# Patient Record
Sex: Male | Born: 1980 | Race: White | Hispanic: No | Marital: Married | Smoking: Current every day smoker
Health system: Southern US, Community
[De-identification: ages and names within clinical notes are randomized; demographics above are authoritative.]

## PROBLEM LIST (undated history)

## (undated) DIAGNOSIS — G35 Multiple sclerosis: Secondary | ICD-10-CM

## (undated) DIAGNOSIS — K219 Gastro-esophageal reflux disease without esophagitis: Secondary | ICD-10-CM

---

## 2015-11-23 ENCOUNTER — Ambulatory Visit
Admission: EM | Admit: 2015-11-23 | Discharge: 2015-11-23 | Disposition: A | Discharge status: Home / Self Care | Payer: BLUE CROSS/BLUE SHIELD | Attending: Family Medicine | Admitting: Family Medicine

## 2015-11-23 ENCOUNTER — Ambulatory Visit (INDEPENDENT_AMBULATORY_CARE_PROVIDER_SITE_OTHER): Payer: BLUE CROSS/BLUE SHIELD

## 2015-11-23 DIAGNOSIS — M7052 Other bursitis of knee, left knee: Secondary | ICD-10-CM | POA: Diagnosis not present

## 2015-11-23 DIAGNOSIS — B360 Pityriasis versicolor: Secondary | ICD-10-CM

## 2015-11-23 HISTORY — DX: Multiple sclerosis: G35

## 2015-11-23 MED ORDER — KETOCONAZOLE 2 % EX CREA: 1.0000 "application " | TOPICAL_CREAM | Freq: Two times a day (BID) | CUTANEOUS | Status: DC

## 2015-11-23 MED ORDER — MELOXICAM 15 MG PO TABS: 15.0000 mg | ORAL_TABLET | Freq: Every day | ORAL | Status: DC

## 2015-11-23 NOTE — Discharge Instructions (Signed)
Bursitis Bursitis is when the fluid-filled sac (bursa) that covers and protects a joint is swollen (inflamed). Bursitis is most common near joints, especially the knees, elbows, hips, and shoulders.  HOME CARE  Take medicines only as told by your doctor.  If you were prescribed an antibiotic medicine, finish it all even if you start to feel better.  Rest the affected area as told by your doctor.  Keep the area raised up.  Avoid doing things that make the pain worse.  Apply ice to the injured area:  Place ice in a plastic bag.  Place a towel between your skin and the bag.  Leave the ice on for 20 minutes, 2-3 times a day.  Use splints, braces, pads, or walking aids as told by your doctor.  Keep all follow-up visits as told by your doctor. This is important. GET HELP IF:   You have more pain with home care.  You have a fever.  You have chills.   This information is not intended to replace advice given to you by your health care provider. Make sure you discuss any questions you have with your health care provider.   Document Released: 04/05/2010 Document Revised: 11/06/2014 Document Reviewed: 01/05/2014 Elsevier Interactive Patient Education 2016 [employer] Inc[employer]  Tinea Versicolor Tinea versicolor is a skin infection that is caused by a type of yeast. It causes a rash that shows up as light or dark patches on the skin. It often occurs on the chest, back, neck, or upper arms. The condition usually does not cause other problems. In most cases, it goes away in a few weeks with treatment. The infection cannot be spread by person to another person. HOME CARE  Take medicines only as told by your doctor.  Scrub your skin every day with a dandruff shampoo as told by your doctor.  Do not scratch your skin in the rash area.  Avoid places that are hot and humid.  Do not use tanning booths.  Try to avoid sweating a lot. GET HELP IF:  Your symptoms get worse.  You have a  fever.  You have redness, swelling, or pain in the area of your rash.  You have fluid, blood, or pus coming from your rash.  Your rash comes back after treatment.   This information is not intended to replace advice given to you by your health care provider. Make sure you discuss any questions you have with your health care provider.   Document Released: 09/28/2008 Document Revised: 11/06/2014 Document Reviewed: 07/28/2014 Elsevier Interactive Patient Education [employer]

## 2015-11-23 NOTE — ED Provider Notes (Signed)
CSN: [id number]     Arrival date & time 11/23/15  1818 History   First MD Initiated Contact with Patient 11/23/15 1934    Nurses notes were reviewed. Chief Complaint  Patient presents with  . Knee Pain  . Rash   Patient was seen for left knee pain. He states that he did jump off his truck about a week ago he started noticing some pain in the left knee afterwards. Did not have that much pain jumping. States is worse when he bends his knee better when he is keeping his knee straight. States no surgery arm are problems with the knee before.  He has a rash on his back extends to his right axillary area. This rash is consistent with rash she's had before where it will spread over his back and is on. Seems like is worse with Odis Luster starts working hard and sweats a lot. He's been on medication before one time they told him that I think it may be a fungal infection. That time the creams did seem to help but he ran out of cream before he could take it long enough time seemed like to him. They moved to the area about months ago and has not established himself with a PCP.     (Consider location/radiation/quality/duration/timing/severity/associated sxs/prior Treatment) Patient is a 35 y.o. male presenting with knee pain and rash. The history is provided by the patient and the spouse. No language interpreter was used.  Knee Pain Location:  Knee Time since incident:  1 week Injury: yes   Mechanism of injury comment:  Jumping off a truck Knee location:  L knee Pain details:    Quality:  Sharp and shooting   Severity:  Moderate   Onset quality:  Unable to specify   Timing:  Sporadic   Progression:  Waxing and waning Chronicity:  New Dislocation: no   Foreign body present:  No foreign bodies Relieved by:  Immobilization and rest Worsened by:  Nothing tried Ineffective treatments:  None tried Associated symptoms: swelling   Associated symptoms: no decreased ROM, no muscle weakness, no numbness and  no stiffness   Risk factors: no concern for non-accidental trauma, no known bone disorder and no recent illness   Rash Location:  Torso and shoulder/arm Shoulder/arm rash location:  R axilla Torso rash location:  Upper back Quality: dryness, itchiness, redness and scaling   Severity:  Moderate Onset quality:  Sudden Timing:  Sporadic Progression:  Unable to specify Context comment:  Worse when he sweats Relieved by:  Nothing Ineffective treatments:  None tried Associated symptoms: no abdominal pain, no diarrhea, no induration, no joint pain, no myalgias, no sore throat, no throat swelling and no tongue swelling     Past Medical History  Diagnosis Date  . Multiple sclerosis (HCC)    History reviewed. No pertinent past surgical history. History reviewed. No pertinent family history. Social History  Substance Use Topics  . Smoking status: Current Every Day Smoker -- 1.50 packs/day  . Smokeless tobacco: None  . Alcohol Use: No    Review of Systems  HENT: Negative for sore throat.   Gastrointestinal: Negative for abdominal pain and diarrhea.  Musculoskeletal: Negative for myalgias, arthralgias and stiffness.  Skin: Positive for rash.  All other systems reviewed and are negative.   Allergies  Shellfish allergy  Home Medications   Prior to Admission medications   Medication Sig Start Date End Date Taking? Authorizing Provider  ketoconazole (NIZORAL) 2 % cream Apply 1 application  topically 2 (two) times daily. 11/23/15   Hassan Rowan, MD  meloxicam (MOBIC) 15 MG tablet Take 1 tablet (15 mg total) by mouth daily. 11/23/15   Hassan Rowan, MD   Meds Ordered and Administered this Visit  Medications - No data to display  BP 144/92 mmHg  Pulse 83  Temp(Src) 98.2 F (36.8 C) (Oral)  Resp 16  Ht  (1.651 m)  Wt 196 lb (88.905 kg)  BMI 32.62 kg/m2  SpO2 100% No data found.   Physical Exam  Constitutional: He is oriented to person, place, and time. He appears  well-developed and well-nourished.  HENT:  Head: Normocephalic and atraumatic.  Eyes: Pupils are equal, round, and reactive to light.  Musculoskeletal: Normal range of motion. He exhibits tenderness. He exhibits no edema.       Left knee: He exhibits no deformity, no laceration, no erythema and no LCL laxity. Tenderness found.       Legs: Patient has what appears be a left medial upper knee bursitis. His versus moved reproduced the pain and tenderness that he has and the bursa appears be somewhat irregular and rough during palpation.  Neurological: He is alert and oriented to person, place, and time.  Skin: Skin is warm. Rash noted. No laceration noted. Rash is maculopapular. He is not diaphoretic.     /6 seconds consistent with a fungal infection    ED Course  Procedures (including critical care time)  Labs Review Labs Reviewed - No data to display  Imaging Review Dg Knee Ap/lat W/sunrise Left  11/23/2015  CLINICAL DATA:  Chronic left knee pain which has worsened over the past 4 days. Initial encounter. EXAM: LEFT KNEE 3 VIEWS COMPARISON:  None. FINDINGS: There is no evidence of fracture, dislocation, or joint effusion. There is no evidence of arthropathy or other focal bone abnormality. Soft tissues are unremarkable. IMPRESSION: Negative exam. Electronically Signed   By: Drusilla Kanner M.D.   On: 11/23/2015 20:05     Visual Acuity Review  Right Eye Distance:   Left Eye Distance:   Bilateral Distance:    Right Eye Near:   Left Eye Near:    Bilateral Near:         MDM   1. Bursitis of left knee   2. Tinea versicolor    Patient will be sent home on Mobic 15 mg 1 tablet a day. Explained to him that he might need to have injection in the left bursa if the pain continues. Will give a work since he worked today for Wednesday and Thursday but he'll need to see a PCP or return for injection versus not better by Thursday. For the rash think this is tinea versicolor recommend  Nizoral cream. Explained to him that he is not going to get Nizoral tablets for me this will need to be followed by PCP a dermatologist and a lack make a decision when to go systemic antifungal medication. Will give Nizoral cream to use twice a day for about 2 weeks and hopefully I can control the rash.      Hassan Rowan, MD 11/23/15 2033

## 2015-11-23 NOTE — ED Notes (Signed)
Left knee pain x 1 week. 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 Pt also c/o rash under right arm "for a while". Rash is intermittent.

## 2015-12-16 ENCOUNTER — Encounter: Payer: Self-pay | Admitting: Gynecology

## 2015-12-16 ENCOUNTER — Ambulatory Visit
Admission: EM | Admit: 2015-12-16 | Discharge: 2015-12-16 | Disposition: A | Discharge status: Home / Self Care | Payer: BLUE CROSS/BLUE SHIELD | Attending: Family Medicine | Admitting: Family Medicine

## 2015-12-16 DIAGNOSIS — K047 Periapical abscess without sinus: Secondary | ICD-10-CM

## 2015-12-16 MED ORDER — KETOROLAC TROMETHAMINE 60 MG/2ML IM SOLN
60.0000 mg | Freq: Once | INTRAMUSCULAR | Status: AC
  Administered 2015-12-16: 60 mg via INTRAMUSCULAR

## 2015-12-16 MED ORDER — MELOXICAM 15 MG PO TABS: 15.0000 mg | ORAL_TABLET | Freq: Every day | ORAL | Status: DC

## 2015-12-16 MED ORDER — AMOXICILLIN-POT CLAVULANATE 875-125 MG PO TABS: 1.0000 | ORAL_TABLET | Freq: Two times a day (BID) | ORAL | Status: DC

## 2015-12-16 MED ORDER — HYDROCODONE-ACETAMINOPHEN 5-325 MG PO TABS: 1.0000 | ORAL_TABLET | Freq: Three times a day (TID) | ORAL | Status: DC | PRN

## 2015-12-16 NOTE — ED Provider Notes (Signed)
CSN: [id number]     Arrival date & time 12/16/15  1820 History   First MD Initiated Contact with Patient 12/16/15 2049    Nurses notes were reviewed. Chief Complaint  Patient presents with  . Facial Pain   Patient reports having a history of dental abscesses. He states he states she's got another one. He's been lucky because most times the resolved at home but not this time. The ports pain in the right upper gum and jaw trimmed underneath the right eye and the right upper lip. States no CC or Maurine Minister but because of cost and lack of insurance has not been. States he is aware. Most of his teeth will be extracted near future.  He still smokes. History of MS no stiff family medical history this time.   (Consider location/radiation/quality/duration/timing/severity/associated sxs/prior Treatment) Patient is a 35 y.o. male presenting with tooth pain. The history is provided by the patient and the spouse.  Dental Pain Quality:  Pressure-like and localized Severity:  Severe Duration:  3 days Timing:  Constant Progression:  Worsening Chronicity:  Recurrent Context: abscess, dental caries, dental fracture and poor dentition   Previous work-up:  Dental exam Relieved by:  Nothing Ineffective treatments:  NSAIDs Associated symptoms: congestion, facial pain, fever, gum swelling, oral bleeding and oral lesions   Risk factors: lack of dental care and periodontal disease     Past Medical History  Diagnosis Date  . Multiple sclerosis (HCC)    History reviewed. No pertinent past surgical history. No family history on file. Social History  Substance Use Topics  . Smoking status: Current Every Day Smoker -- 1.50 packs/day  . Smokeless tobacco: None  . Alcohol Use: No    Review of Systems  Constitutional: Positive for fever.  HENT: Positive for congestion and mouth sores.     Allergies  Shellfish allergy  Home Medications   Prior to Admission medications   Medication Sig Start Date End  Date Taking? Authorizing Provider  ketoconazole (NIZORAL) 2 % cream Apply 1 application topically 2 (two) times daily. 11/23/15  Yes Hassan Rowan, MD  meloxicam (MOBIC) 15 MG tablet Take 1 tablet (15 mg total) by mouth daily. 11/23/15  Yes Hassan Rowan, MD  amoxicillin-clavulanate (AUGMENTIN) 875-125 MG tablet Take 1 tablet by mouth 2 (two) times daily. 12/16/15   Hassan Rowan, MD  HYDROcodone-acetaminophen (NORCO) 5-325 MG tablet Take 1 tablet by mouth every 8 (eight) hours as needed for moderate pain. 12/16/15   Hassan Rowan, MD  meloxicam (MOBIC) 15 MG tablet Take 1 tablet (15 mg total) by mouth daily. 12/16/15   Hassan Rowan, MD   Meds Ordered and Administered this Visit   Medications  ketorolac (TORADOL) injection 60 mg (not administered)    BP 145/97 mmHg  Pulse 77  Temp(Src) 98.5 F (36.9 C) (Oral)  Resp 18  Ht  (1.651 m)  Wt 195 lb (88.451 kg)  BMI 32.45 kg/m2  SpO2 99% No data found.   Physical Exam  Constitutional: He is oriented to person, place, and time. He appears well-developed and well-nourished.  HENT:  Head: Normocephalic and atraumatic.  Right Ear: External ear normal.  Left Ear: External ear normal.  Eyes: Conjunctivae are normal. Pupils are equal, round, and reactive to light.  Neck: Neck supple. No tracheal deviation present.  Musculoskeletal: He exhibits no edema.  Neurological: He is alert and oriented to person, place, and time.  Skin: Skin is warm and dry. No erythema.  Psychiatric: He has a normal  mood and affect.  Vitals reviewed.   ED Course  Procedures (including critical care time)  Labs Review Labs Reviewed - No data to display  Imaging Review No results found.   Visual Acuity Review  Right Eye Distance:   Left Eye Distance:   Bilateral Distance:    Right Eye Near:   Left Eye Near:    Bilateral Near:         MDM   1. Dental abscess    Patient will be placed on Mobic Vicodin for pain and Augmentin infection of the abscess.  Strongly suggest that he consider going to [facility] dental clinic for dental work since he has extensive dental issues and problems. Also stressed the need to stop smoking.    Hassan Rowan, MD 12/16/15 2113

## 2015-12-16 NOTE — Discharge Instructions (Signed)
Dental Abscess A dental abscess is pus in or around a tooth. HOME CARE  Take medicines only as told by your dentist.  If you were prescribed antibiotic medicine, finish all of it even if you start to feel better.  Rinse your mouth (gargle) often with salt water.  Do not drive or use heavy machinery, like a lawn mower, while taking pain medicine.  Do not apply heat to the outside of your mouth.  Keep all follow-up visits as told by your dentist. This is important. GET HELP IF:  Your pain is worse, and medicine does not help. GET HELP RIGHT AWAY IF:  You have a fever or chills.  Your symptoms suddenly get worse.  You have a very bad headache.  You have problems breathing or swallowing.  You have trouble opening your mouth.  You have puffiness (swelling) in your neck or around your eye.   This information is not intended to replace advice given to you by your health care provider. Make sure you discuss any questions you have with your health care provider.   Document Released: 03/02/2015 Document Reviewed: 03/02/2015 Elsevier Interactive Patient Education 2016 [employer]  Dental Care and Dentist Visits Dental care supports good overall health. Regular dental visits can also help you avoid dental pain, bleeding, infection, and other more serious health problems in the future. It is important to keep the mouth healthy because diseases in the teeth, gums, and other oral tissues can spread to other areas of the body. Some problems, such as diabetes, heart disease, and pre-term labor have been associated with poor oral health.  See your dentist every 6 months. If you experience emergency problems such as a toothache or broken tooth, go to the dentist right away. If you see your dentist regularly, you may catch problems early. It is easier to be treated for problems in the early stages.  WHAT TO EXPECT AT A DENTIST VISIT  Your dentist will look for many common oral health problems  and recommend proper treatment. At your regular dental visit, you can expect:  Gentle cleaning of the teeth and gums. This includes scraping and polishing. This helps to remove the sticky substance around the teeth and gums (plaque). Plaque forms in the mouth shortly after eating. Over time, plaque hardens on the teeth as tartar. If tartar is not removed regularly, it can cause problems. Cleaning also helps remove stains.  Periodic X-rays. These pictures of the teeth and supporting bone will help your dentist assess the health of your teeth.  Periodic fluoride treatments. Fluoride is a natural mineral shown to help strengthen teeth. Fluoride treatmentinvolves applying a fluoride gel or varnish to the teeth. It is most commonly done in children.  Examination of the mouth, tongue, jaws, teeth, and gums to look for any oral health problems, such as:  Cavities (dental caries). This is decay on the tooth caused by plaque, sugar, and acid in the mouth. It is best to catch a cavity when it is small.  Inflammation of the gums caused by plaque buildup (gingivitis).  Problems with the mouth or malformed or misaligned teeth.  Oral cancer or other diseases of the soft tissues or jaws. KEEP YOUR TEETH AND GUMS HEALTHY For healthy teeth and gums, follow these general guidelines as well as your dentist's specific advice:  Have your teeth professionally cleaned at the dentist every 6 months.  Brush twice daily with a fluoride toothpaste.  Floss your teeth daily.  Ask your dentist if  you need fluoride supplements, treatments, or fluoride toothpaste.  Eat a healthy diet. Reduce foods and drinks with added sugar.  Avoid smoking. TREATMENT FOR ORAL HEALTH PROBLEMS If you have oral health problems, treatment varies depending on the conditions present in your teeth and gums.  Your caregiver will most likely recommend good oral hygiene at each visit.  For cavities, gingivitis, or other oral health  disease, your caregiver will perform a procedure to treat the problem. This is typically done at a separate appointment. Sometimes your caregiver will refer you to another dental specialist for specific tooth problems or for surgery. SEEK IMMEDIATE DENTAL CARE IF:  You have pain, bleeding, or soreness in the gum, tooth, jaw, or mouth area.  A permanent tooth becomes loose or separated from the gum socket.  You experience a blow or injury to the mouth or jaw area.   This information is not intended to replace advice given to you by your health care provider. Make sure you discuss any questions you have with your health care provider.   Document Released: 06/28/2011 Document Revised: 01/08/2012 Document Reviewed: 06/28/2011 Elsevier Interactive Patient Education 2016 [employer] Inc.  Dental Pain Dental pain may be caused by many things, including:  Tooth decay (cavities or caries). Cavities cause the nerve of your tooth to be open to air and hot or cold temperatures. This can cause pain or discomfort.  Abscess or infection. A dental abscess is an area that is full of infected pus from a bacterial infection in the inner part of the tooth (pulp). It usually happens at the end of the tooth's root.  Injury.  An unknown reason (idiopathic). Your pain may be mild or severe. It may only happen when:  You are chewing.  You are exposed to hot or cold temperature.  You are eating or drinking sugary foods or beverages, such as:  Soda.  Candy. Your pain may also be there all of the time. HOME CARE Watch your dental pain for any changes. Do these things to lessen your discomfort:  Take medicines only as told by your dentist.  If your dentist tells you to take an antibiotic medicine, finish all of it even if you start to feel better.  Keep all follow-up visits as told by your dentist. This is important.  Do not apply heat to the outside of your face.  Rinse your mouth or gargle with  salt water if told by your dentist. This helps with pain and swelling.  You can make salt water by adding  tsp of salt to 1 cup of warm water.  Apply ice to the painful area of your face:  Put ice in a plastic bag.  Place a towel between your skin and the bag.  Leave the ice on for 20 minutes, 2-3 times per day.  Avoid foods or drinks that cause you pain, such as:  Very hot or very cold foods or drinks.  Sweet or sugary foods or drinks. GET HELP IF:  Your pain is not helped with medicines.  Your symptoms are worse.  You have new symptoms. GET HELP RIGHT AWAY IF:  You cannot open your mouth.  You are having trouble breathing or swallowing.  You have a fever.  Your face, neck, or jaw is puffy (swollen).   This information is not intended to replace advice given to you by your health care provider. Make sure you discuss any questions you have with your health care provider.   Document Released:  04/03/2008 Document Revised: 03/02/2015 Document Reviewed: 10/12/2014 Elsevier Interactive Patient Education [employer]

## 2015-12-16 NOTE — ED Notes (Signed)
Patient c/o right upper jaw abscess. Patient stated painful x 3 day.

## 2016-12-21 IMAGING — CR DG KNEE AP/LAT W/ SUNRISE*L*
5 series · 5 of 5 positions shown · non-contrast
Comparison: None.

CLINICAL DATA: Chronic left knee pain which has worsened over the
past 4 days. Initial encounter.

EXAM:
LEFT KNEE 3 VIEWS

[knee ap]
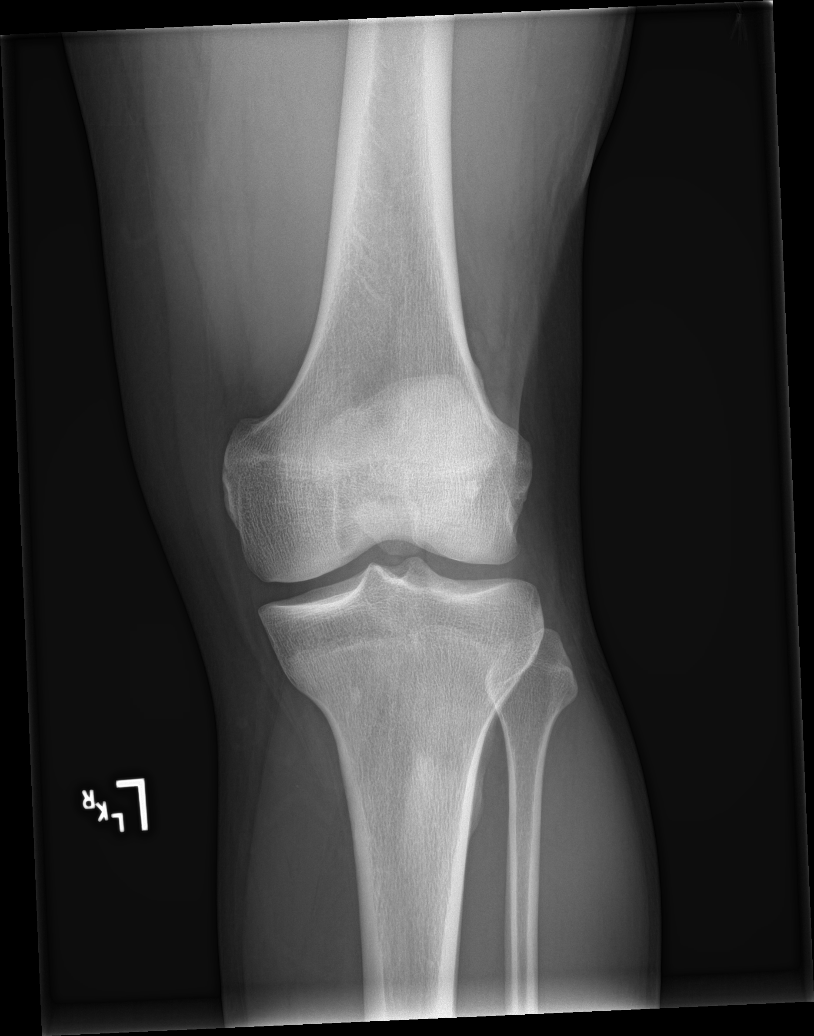

[knee lat (1 of 2)]
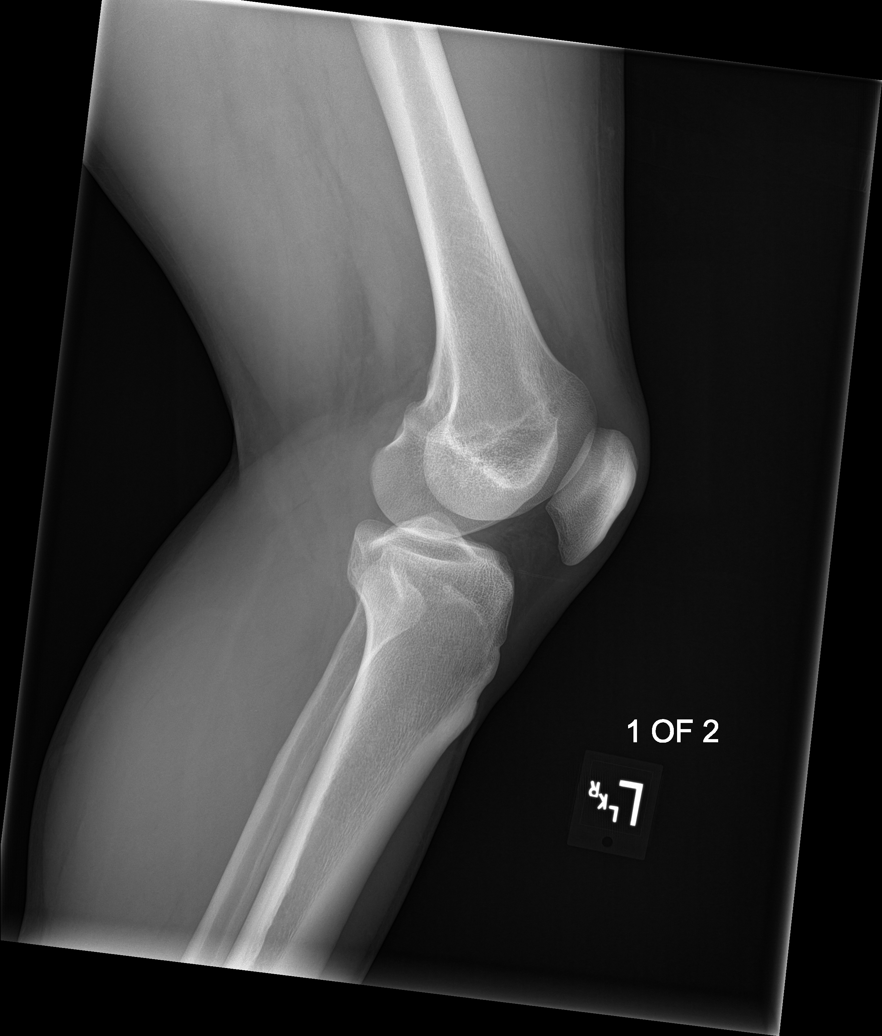

[patella skyline (1 of 2)]
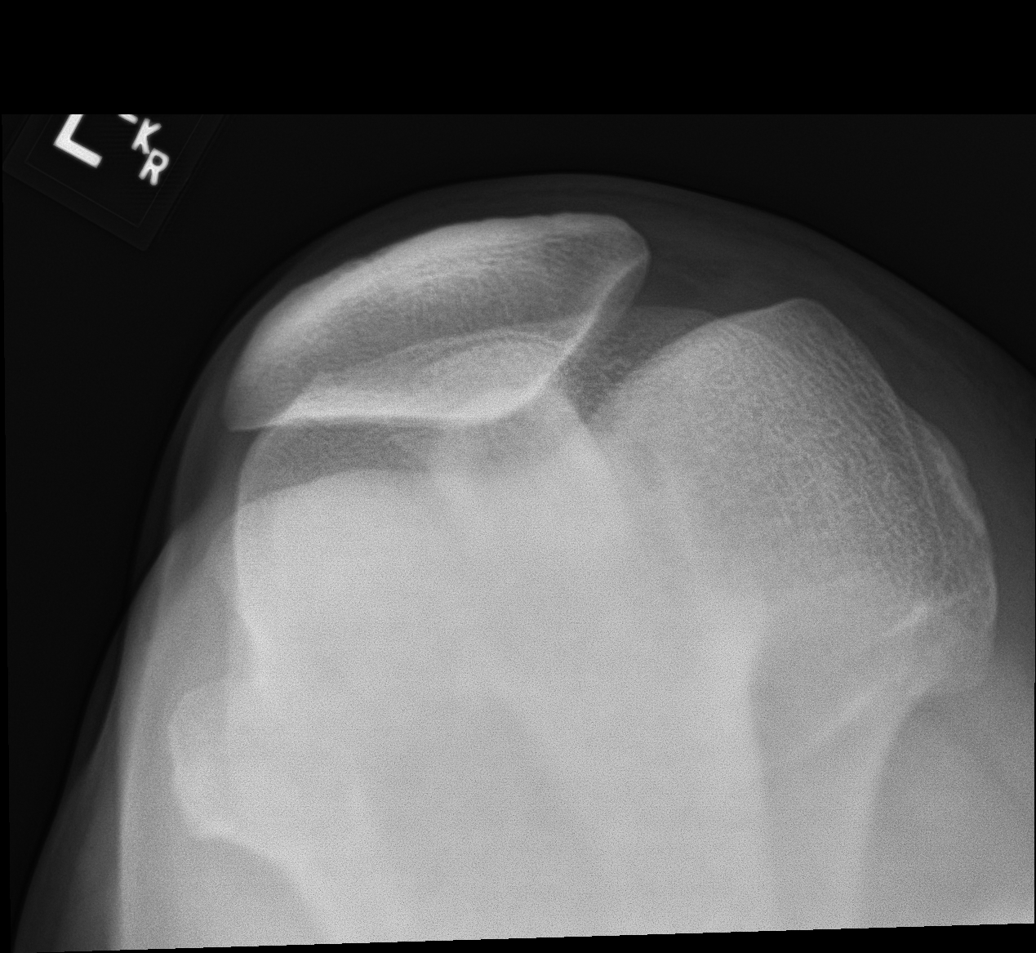

[knee lat (2 of 2)]
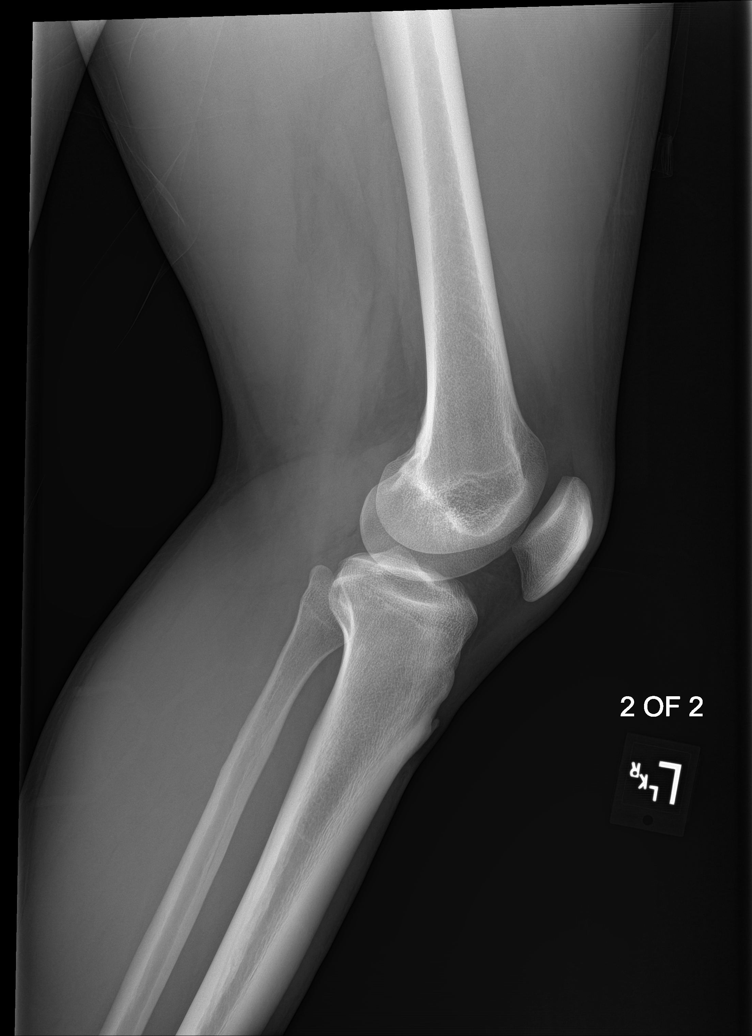

[patella skyline (2 of 2)]
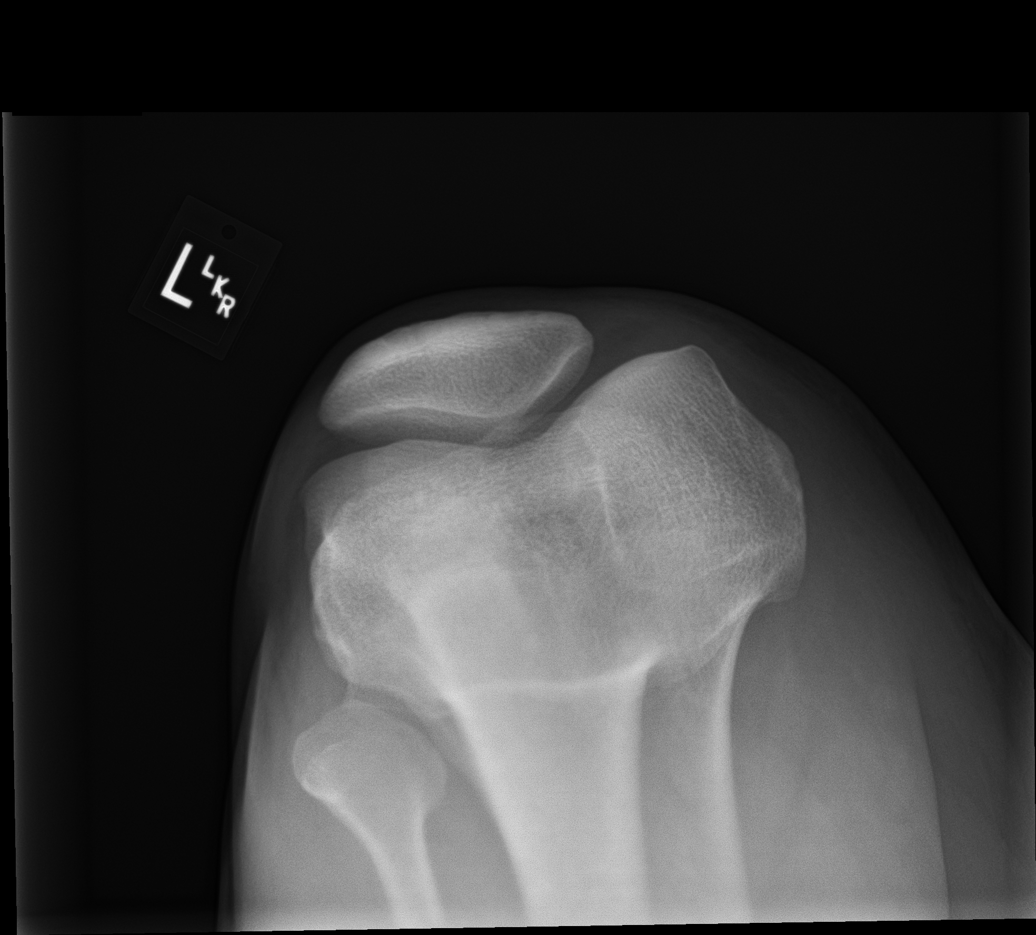

[5 of 5 positions shown; findings below may reference images not displayed]

FINDINGS: There is no evidence of fracture, dislocation, or joint effusion.
There is no evidence of arthropathy or other focal bone abnormality.
Soft tissues are unremarkable.
IMPRESSION: Negative exam.

## 2017-03-05 ENCOUNTER — Ambulatory Visit
Admission: EM | Admit: 2017-03-05 | Discharge: 2017-03-05 | Disposition: A | Discharge status: Home / Self Care | Payer: 59 | Attending: Family Medicine | Admitting: Family Medicine

## 2017-03-05 ENCOUNTER — Encounter: Payer: Self-pay | Admitting: Emergency Medicine

## 2017-03-05 DIAGNOSIS — S0501XA Injury of conjunctiva and corneal abrasion without foreign body, right eye, initial encounter: Secondary | ICD-10-CM

## 2017-03-05 DIAGNOSIS — T1591XA Foreign body on external eye, part unspecified, right eye, initial encounter: Secondary | ICD-10-CM | POA: Diagnosis not present

## 2017-03-05 DIAGNOSIS — R21 Rash and other nonspecific skin eruption: Secondary | ICD-10-CM

## 2017-03-05 MED ORDER — MOXIFLOXACIN HCL 0.5 % OP SOLN: 1.0000 [drp] | Freq: Three times a day (TID) | OPHTHALMIC | 0 refills | Status: DC

## 2017-03-05 MED ORDER — CLOTRIMAZOLE-BETAMETHASONE 1-0.05 % EX CREA: TOPICAL_CREAM | CUTANEOUS | 0 refills | Status: DC

## 2017-03-05 NOTE — ED Provider Notes (Signed)
[facility]    CSN: [id number] Arrival date & time: 03/05/17  1656     History   Chief Complaint Chief Complaint  Patient presents with  . Eye Problem  . Rash    HPI Phillip Palmer C Krotzer is a 36 y.o. male.    Eye Problem  Location:  Right eye Quality:  Tearing and foreign body sensation Severity:  Moderate Onset quality:  Sudden Duration:  3 hours Timing:  Constant Progression:  Unchanged Chronicity:  New Context: foreign body (dirt/dust)   Foreign body:  Dirt Relieved by:  Nothing Associated symptoms: redness and tearing   Associated symptoms: no blurred vision, no crusting, no decreased vision, no discharge, no double vision, no facial rash, no headaches, no inflammation, no itching, no nausea, no numbness, no photophobia, no scotomas, no swelling, no tingling, no vomiting and no weakness   Risk factors: no conjunctival hemorrhage, no exposure to pinkeye, no previous injury to eye and no recent URI   Rash  Associated symptoms: no headaches, no nausea, no periorbital edema and not vomiting     Past Medical History:  Diagnosis Date  . Multiple sclerosis (HCC)     There are no active problems to display for this patient.   History reviewed. No pertinent surgical history.     Home Medications    Prior to Admission medications   Medication Sig Start Date End Date Taking? Authorizing Provider  clotrimazole-betamethasone (LOTRISONE) cream Apply to affected area 2 times daily prn 03/05/17   Payton Mccallumonty, Taquila Leys, MD  moxifloxacin (VIGAMOX) 0.5 % ophthalmic solution Place 1 drop into the right eye 3 (three) times daily. For 7 days 03/05/17   Payton Mccallumonty, Ezell Melikian, MD    Family History History reviewed. No pertinent family history.  Social History Social History  Substance Use Topics  . Smoking status: Current Every Day Smoker    Packs/day: 1.50  . Smokeless tobacco: Not on file  . Alcohol use No     Allergies   Shellfish allergy   Review of Systems Review  of Systems  Eyes: Positive for redness. Negative for blurred vision, double vision, photophobia, discharge and itching.  Gastrointestinal: Negative for nausea and vomiting.  Skin: Positive for rash.  Neurological: Negative for tingling, weakness, numbness and headaches.     Physical Exam Triage Vital Signs ED Triage Vitals  Enc Vitals Group     BP 03/05/17 1724 (!) 153/92     Pulse Rate 03/05/17 1724 86     Resp 03/05/17 1724 16     Temp 03/05/17 1724 98 F (36.7 C)     Temp Source 03/05/17 1724 Oral     SpO2 03/05/17 1724 99 %     Weight 03/05/17 1725 198 lb (89.8 kg)     Height 03/05/17 1725 5\' 5"  (1.651 m)     Head Circumference --      Peak Flow --      Pain Score 03/05/17 1725 5     Pain Loc --      Pain Edu? --      Excl. in GC? --    No data found.   Updated Vital Signs BP (!) 153/92 (BP Location: Left Arm)   Pulse 86   Temp 98 F (36.7 C) (Oral)   Resp 16   Ht 5\' 5"  (1.651 m)   Wt 198 lb (89.8 kg)   SpO2 99%   BMI 32.95 kg/m   Visual Acuity Right Eye Distance: 20/40 uncorrected Left Eye Distance:  20/20 uncorrected Bilateral Distance:    Right Eye Near:   Left Eye Near:    Bilateral Near:     Physical Exam  Constitutional: He appears well-developed and well-nourished. No distress.  Eyes: EOM are normal. Pupils are equal, round, and reactive to light. Right eye exhibits no discharge. Foreign body present in the right eye. Left eye exhibits no discharge. Right conjunctiva is injected. Left conjunctiva is not injected. No scleral icterus.  Slit lamp exam:      The right eye shows corneal abrasion and fluorescein uptake.  Skin: He is not diaphoretic.  Nursing note and vitals reviewed.    UC Treatments / Results  Labs (all labs ordered are listed, but only abnormal results are displayed) Labs Reviewed - No data to display  EKG  EKG Interpretation None       Radiology No results found.  Procedures .Foreign Body Removal Date/Time:  03/05/2017 6:47 PM Performed by: Payton Mccallum Authorized by: Payton Mccallum  Consent: Verbal consent obtained. Risks and benefits: risks, benefits and alternatives were discussed Consent given by: patient Patient understanding: patient states understanding of the procedure being performed Patient identity confirmed: verbally with patient Body area: eye Location details: right eyelid  Anesthesia: Local Anesthetic: tetracaine drops  Sedation: Patient sedated: no Patient restrained: no Localization method: eyelid eversion Removal mechanism: moist cotton swab Eye examined with fluorescein. No fluorescein uptake. Corneal abrasion size: small Corneal abrasion location: lateral No residual rust ring present. Depth: superficial Complexity: simple 1 objects recovered. Objects recovered: pinpoint dirt speck Post-procedure assessment: foreign body removed Patient tolerance: Patient tolerated the procedure well with no immediate complications   (including critical care time)  Medications Ordered in UC Medications - No data to display   Initial Impression / Assessment and Plan / UC Course  I have reviewed the triage vital signs and the nursing notes.  Pertinent labs & imaging results that were available during my care of the patient were reviewed by me and considered in my medical decision making (see chart for details).       Final Clinical Impressions(s) / UC Diagnoses   Final diagnoses:  Abrasion of right cornea, initial encounter  Foreign body, eye, right, initial encounter  Rash    New Prescriptions Discharge Medication List as of 03/05/2017  5:55 PM    START taking these medications   Details  clotrimazole-betamethasone (LOTRISONE) cream Apply to affected area 2 times daily prn, Normal    moxifloxacin (VIGAMOX) 0.5 % ophthalmic solution Place 1 drop into the right eye 3 (three) times daily. For 7 days, Starting Mon 03/05/2017, Normal       1. diagnosis reviewed  with patient 2. rx as per orders above; reviewed possible side effects, interactions, risks and benefits  3. otc analgesics prn  4. Follow-up with ophthalmology this week if symptoms worsen or don't improve   Payton Mccallum, MD 03/05/17 [phone]

## 2017-03-05 NOTE — ED Triage Notes (Signed)
Patient c/o redness, irritation and discharge in his right eye that started about 2 hours ago.  Patient c/o itchy rash on his back off and on for a year.

## 2018-03-18 ENCOUNTER — Other Ambulatory Visit: Payer: Self-pay

## 2018-03-18 ENCOUNTER — Encounter: Payer: Self-pay | Admitting: Emergency Medicine

## 2018-03-18 ENCOUNTER — Ambulatory Visit
Admission: EM | Admit: 2018-03-18 | Discharge: 2018-03-18 | Disposition: A | Discharge status: Home / Self Care | Payer: Commercial Managed Care - PPO | Attending: Family Medicine | Admitting: Family Medicine

## 2018-03-18 DIAGNOSIS — S29012A Strain of muscle and tendon of back wall of thorax, initial encounter: Secondary | ICD-10-CM

## 2018-03-18 DIAGNOSIS — M25511 Pain in right shoulder: Secondary | ICD-10-CM

## 2018-03-18 MED ORDER — KETOROLAC TROMETHAMINE 10 MG PO TABS: 10.0000 mg | ORAL_TABLET | Freq: Three times a day (TID) | ORAL | 0 refills | Status: DC | PRN

## 2018-03-18 MED ORDER — CYCLOBENZAPRINE HCL 10 MG PO TABS: 10.0000 mg | ORAL_TABLET | Freq: Every day | ORAL | 0 refills | Status: DC

## 2018-03-18 NOTE — Discharge Instructions (Addendum)
Stretch, heat/ice

## 2018-03-18 NOTE — ED Triage Notes (Signed)
Patient in today c/o right shoulder pain x 5 days. No injury noted.

## 2018-03-18 NOTE — ED Provider Notes (Signed)
[facility]    CSN: [id number] Arrival date & time: 03/18/18  1815     History   Chief Complaint Chief Complaint  Patient presents with  . Shoulder Pain    right    HPI Phillip Palmer is a 37 y.o. male.   37 yo male with a c/o right shoulder pain and right upper back pain.  Denies any trauma, injuries, fevers, chills, numbness/tingling.    The history is provided by the patient.  Shoulder Pain    History reviewed. No pertinent past medical history.  There are no active problems to display for this patient.   History reviewed. No pertinent surgical history.     Home Medications    Prior to Admission medications   Medication Sig Start Date End Date Taking? Authorizing Provider  clotrimazole-betamethasone (LOTRISONE) cream Apply to affected area 2 times daily prn 03/05/17   Payton Mccallum, MD  cyclobenzaprine (FLEXERIL) 10 MG tablet Take 1 tablet (10 mg total) by mouth at bedtime. 03/18/18   Payton Mccallum, MD  ketorolac (TORADOL) 10 MG tablet Take 1 tablet (10 mg total) by mouth every 8 (eight) hours as needed. 03/18/18   Payton Mccallum, MD  moxifloxacin (VIGAMOX) 0.5 % ophthalmic solution Place 1 drop into the right eye 3 (three) times daily. For 7 days 03/05/17   Payton Mccallum, MD    Family History Family History  Problem Relation Age of Onset  . Multiple sclerosis Mother   . Alcohol abuse Father     Social History Social History   Tobacco Use  . Smoking status: Current Every Day Smoker    Packs/day: 1.00    Years: 21.00    Pack years: 21.00  . Smokeless tobacco: Never Used  Substance Use Topics  . Alcohol use: No  . Drug use: Never     Allergies   Shellfish allergy   Review of Systems Review of Systems   Physical Exam Triage Vital Signs ED Triage Vitals  Enc Vitals Group     BP 03/18/18 1834 (!) 140/91     Pulse Rate 03/18/18 1834 91     Resp 03/18/18 1834 16     Temp 03/18/18 1834 98.4 F (36.9 C)     Temp Source 03/18/18  1834 Oral     SpO2 03/18/18 1834 98 %     Weight 03/18/18 1833 208 lb (94.3 kg)     Height 03/18/18 1833  (1.651 m)     Head Circumference --      Peak Flow --      Pain Score 03/18/18 1833 7     Pain Loc --      Pain Edu? --      Excl. in GC? --    No data found.  Updated Vital Signs BP (!) 140/91 (BP Location: Left Arm)   Pulse 91   Temp 98.4 F (36.9 C) (Oral)   Resp 16   Ht  (1.651 m)   Wt 208 lb (94.3 kg)   SpO2 98%   BMI 34.61 kg/m   Visual Acuity Right Eye Distance:   Left Eye Distance:   Bilateral Distance:    Right Eye Near:   Left Eye Near:    Bilateral Near:     Physical Exam  Constitutional: He appears well-developed and well-nourished. No distress.  Musculoskeletal:       Right shoulder: Normal.       Cervical back: He exhibits tenderness (over the right trapezius  muscle) and spasm. He exhibits normal range of motion, no bony tenderness, no swelling, no edema, no deformity, no laceration, no pain and normal pulse.  Skin: He is not diaphoretic.  Nursing note and vitals reviewed.    UC Treatments / Results  Labs (all labs ordered are listed, but only abnormal results are displayed) Labs Reviewed - No data to display  EKG None  Radiology No results found.  Procedures Procedures (including critical care time)  Medications Ordered in UC Medications - No data to display  Initial Impression / Assessment and Plan / UC Course  I have reviewed the triage vital signs and the nursing notes.  Pertinent labs & imaging results that were available during my care of the patient were reviewed by me and considered in my medical decision making (see chart for details).     Final Clinical Impressions(s) / UC Diagnoses   Final diagnoses:  Acute pain of right shoulder  Upper back strain, initial encounter     Discharge Instructions     Stretch, heat/ice   ED Prescriptions    Medication Sig Dispense Auth. Provider   cyclobenzaprine  (FLEXERIL) 10 MG tablet Take 1 tablet (10 mg total) by mouth at bedtime. 30 tablet Payton Mccallum, MD   ketorolac (TORADOL) 10 MG tablet Take 1 tablet (10 mg total) by mouth every 8 (eight) hours as needed. 15 tablet Payton Mccallum, MD      1. diagnosis reviewed with patient 2. rx as per orders above; reviewed possible side effects, interactions, risks and benefits  3. Recommend supportive treatment with stretch, heat/ice 4. Follow-up prn if symptoms worsen or don't improveControlled Substance Prescriptions [place] Controlled Substance Registry consulted? Not Applicable   Payton Mccallum, MD 03/18/18 [phone]

## 2020-03-10 ENCOUNTER — Other Ambulatory Visit: Payer: Self-pay

## 2020-03-10 ENCOUNTER — Ambulatory Visit
Admission: EM | Admit: 2020-03-10 | Discharge: 2020-03-10 | Disposition: A | Discharge status: Home / Self Care | Payer: Commercial Managed Care - PPO | Attending: Family Medicine | Admitting: Family Medicine

## 2020-03-10 DIAGNOSIS — K047 Periapical abscess without sinus: Secondary | ICD-10-CM | POA: Diagnosis not present

## 2020-03-10 MED ORDER — CEFTRIAXONE SODIUM 1 G IJ SOLR
1.0000 g | Freq: Once | INTRAMUSCULAR | Status: AC
  Administered 2020-03-10: 1 g via INTRAMUSCULAR

## 2020-03-10 MED ORDER — AMOXICILLIN 875 MG PO TABS: 875.0000 mg | ORAL_TABLET | Freq: Two times a day (BID) | ORAL | 0 refills | Status: DC

## 2020-03-10 NOTE — ED Triage Notes (Signed)
Left sided facial swelling and dental pain x 2 days. Tylenol and Advil helping with pain.

## 2020-03-10 NOTE — Discharge Instructions (Addendum)
Take medication as prescribed. Rest. Drink plenty of fluids. Monitor. Ibuprofen and tylenol as needed. Rinse mouth frequently with salt water.   Follow up with dentist as soon as possible.   Follow up with your primary care physician this week as needed. Return to Urgent care for new or worsening concerns.    OPTIONS FOR DENTAL FOLLOW UP CARE  [place] Department of Health and Human Services - Local Safety Net Dental Clinics [email or web address]   [facility] [phone])  [place]arl Ma [phone])  [place] [phone] ext [phone])  [facility] [phone])  [facility] [phone]) This clinic caters to the indigent population and is on a lottery system. Location: [employer] [facility], [employer], [address], [place] Clinic Hours: Wednesdays from 6pm - 9pm, patients seen by a lottery system. For dates, call or go to [email or web address] Services: Cleanings, fillings and simple extractions. Payment Options: DENTAL WORK IS FREE OF CHARGE. Bring proof of income or support. Best way to get seen: Arrive at 5:15 pm - this is a lottery, NOT first come/first serve, so arriving earlier will not increase your chances of being seen.     [facility] [phone] Select option 1 for emergencies   Location: [facility], [place], [address], [place] Clinic Hours: No walk-ins accepted - call the day before to schedule an appointment. Check in times are 9:30 am and 1:30 pm. Services: Simple extractions, temporary fillings, pulpectomy/pulp debridement, uncomplicated abscess drainage. Payment Options: PAYMENT IS DUE AT THE TIME OF SERVICE.  Fee is usually $100-200, additional surgical procedures (e.g. abscess drainage) may be extra. Cash, checks, Visa/MasterCard accepted.  Can file Medicaid  if patient is covered for dental - patient should call case worker to check. No discount for [facility] patients. Best way to get seen: MUST call the day before and get onto the schedule. Can usually be seen the next 1-2 days. No walk-ins accepted.     [facility] [phone]   Location: [facility], [address], [place] Clinic Hours: M, W, Th, F 8am or 1:30pm, Tues 9a or 1:30 - first come/first served. Services: Simple extractions, temporary fillings, uncomplicated abscess drainage.  You do not need to be an [facility] resident. Payment Options: PAYMENT IS DUE AT THE TIME OF SERVICE. Dental insurance, otherwise sliding scale - bring proof of income or support. Depending on income and treatment needed, cost is usually $50-200. Best way to get seen: Arrive early as it is first come/first served.     [facility] [phone]   Location: [address] Clinic Hours: Mon-Thu 8a-5p Services: Most basic dental services including extractions and fillings. Payment Options: PAYMENT IS DUE AT THE TIME OF SERVICE. Sliding scale, up to 50% off - bring proof if income or support. Medicaid with dental option accepted. Best way to get seen: Call to schedule an appointment, can usually be seen within 2 weeks OR they will try to see walk-ins - show up at 8a or 2p (you may have to wait).     [facility] [phone] [place] RESIDENTS ONLY   Location: [facility], [address], [place], [place] [address] Clinic Hours: By appointment only. Monday - Thursday 8am-5pm, Friday 8am-12pm Services: Cleanings, fillings, extractions. Payment Options: PAYMENT IS DUE AT THE TIME OF SERVICE. Cash, Visa or MasterCard. Sliding scale - $30 minimum per service. Best  way to get seen: Come in to office, complete packet and make an appointment - need proof of income or  support monies for each household member and proof of [facility] residence. Usually takes about a month to get in.     [facility] [phone]   Location: [address] [place] Clinic Hours: Walk-in Urgent Care Dental Services are offered Monday-Friday mornings only. The numbers of emergencies accepted daily is limited to the number of providers available. Maximum 15 - Mondays, Wednesdays & Thursdays Maximum 10 - Tuesdays & Fridays Services: You do not need to be a [facility] resident to be seen for a dental emergency. Emergencies are defined as pain, swelling, abnormal bleeding, or dental trauma. Walkins will receive x-rays if needed. NOTE: Dental cleaning is not an emergency. Payment Options: PAYMENT IS DUE AT THE TIME OF SERVICE. Minimum co-pay is $40.00 for uninsured patients. Minimum co-pay is $3.00 for Medicaid with dental coverage. Dental Insurance is accepted and must be presented at time of visit. Medicare does not cover dental. Forms of payment: Cash, credit card, checks. Best way to get seen: If not previously registered with the clinic, walk-in dental registration begins at 7:15 am and is on a first come/first serve basis. If previously registered with the clinic, call to make an appointment.     [facility] [place] ONLY   Location: [address] [place], [place] Clinic Hours: Mon-Thu 10a-2p Services: Extractions only! Payment Options: FREE (donations accepted) - bring proof of income or support Best way to get seen: Call and schedule an appointment OR come at 8am on the 1st Monday of every month (except for holidays) when it is first come/first served.     [facility] [phone]   Location: [place], [place] Clinic Hours: Friday mornings Services, Payment Options, Best way to get seen: Call for info

## 2020-03-10 NOTE — ED Provider Notes (Signed)
[facility] ____________________________________________  Time seen: Approximately 10:08 AM  I have reviewed the triage vital signs and the nursing notes.   HISTORY  Chief Complaint Facial Swelling and Dental Pain   HPI Phillip Palmer is a 39 y.o. male presenting for evaluation of left facial swelling and pain.  Patient reports for the last 2 days he has had a left upper tooth ache, in which she has taken ibuprofen for that helped.  States when he woke up this morning he had left facial swelling and the pain had improved.  States he has had dental abscess before with similar presentation.  States otherwise doing well.  Denies fevers, difficulty eating, drainage, chest pain or shortness of breath, recent sickness.  Denies recent antibiotic use.  Denies other aggravating alleviating factors.  States that he has a lot of dental work that is needed.    History reviewed. No pertinent past medical history.  There are no problems to display for this patient.   History reviewed. No pertinent surgical history.   No current facility-administered medications for this encounter.  Current Outpatient Medications:  .  acetaminophen (TYLENOL) 325 MG tablet, Take 650 mg by mouth every 6 (six) hours as needed., Disp: , Rfl:  .  ibuprofen (ADVIL) 200 MG tablet, Take 200 mg by mouth every 6 (six) hours as needed., Disp: , Rfl:  .  amoxicillin (AMOXIL) 875 MG tablet, Take 1 tablet (875 mg total) by mouth 2 (two) times daily., Disp: 20 tablet, Rfl: 0  Allergies Shellfish allergy  Family History  Problem Relation Age of Onset  . Multiple sclerosis Mother   . Alcohol abuse Father     Social History Social History   Tobacco Use  . Smoking status: Current Every Day Smoker    Packs/day: 1.00    Years: 21.00    Pack years: 21.00  . Smokeless tobacco: Never Used  Substance Use Topics  . Alcohol use: No  . Drug use: Never    Review of Systems Constitutional: No fever ENT:  No sore throat.  Positive dental pain and facial swelling. Cardiovascular: Denies chest pain. Respiratory: Denies shortness of breath. Gastrointestinal: No abdominal pain.  No nausea, no vomiting.  Musculoskeletal: Negative for back pain. Skin: Negative for rash.   ____________________________________________   PHYSICAL EXAM:  VITAL SIGNS: ED Triage Vitals  Enc Vitals Group     BP 03/10/20 0853 (!) 153/103     Pulse Rate 03/10/20 0853 81     Resp 03/10/20 0853 16     Temp 03/10/20 0853 97.7 F (36.5 C)     Temp src --      SpO2 03/10/20 0853 100 %     Weight 03/10/20 0854 191 lb (86.6 kg)     Height 03/10/20 0854 5\' 5"  (1.651 m)     Head Circumference --      Peak Flow --      Pain Score 03/10/20 0854 1     Pain Loc --      Pain Edu? --      Excl. in GC? --     Constitutional: Alert and oriented. Well appearing and in no acute distress. Eyes: Conjunctivae are normal.  ENT      Head: Normocephalic      Nose: No congestion/      Mouth/Throat: Mucous membranes are moist.Oropharynx non-erythematous.  Widespread dental decay with multiple fractures and caries.  Left upper gumline with mild erythema and tenderness with mild swelling, left  cheek with mild localized swelling, no induration or erythema. Hematological/Lymphatic/Immunilogical: No cervical lymphadenopathy. Cardiovascular: Normal rate, regular rhythm. Grossly normal heart sounds.  Good peripheral circulation. Respiratory: Normal respiratory effort without tachypnea nor retractions. Breath sounds are clear and equal bilaterally. No wheezes, rales, rhonchi. Musculoskeletal:  Steady gait.  Neurologic:  Normal speech and language. Speech is normal. No gait instability.  Skin:  Skin is warm, dry and intact. No rash noted. Psychiatric: Mood and affect are normal. Speech and behavior are normal. Patient exhibits appropriate insight and judgment   ___________________________________________   LABS (all labs ordered are  listed, but only abnormal results are displayed)  Labs Reviewed - No data to display   PROCEDURES Procedures    INITIAL IMPRESSION / ASSESSMENT AND PLAN / ED COURSE  Pertinent labs & imaging results that were available during my care of the patient were reviewed by me and considered in my medical decision making (see chart for details).  Well-appearing patient.  Left upper dental infection.  1 g IM Rocephin given in urgent care.  Will treat with oral amoxicillin.  Continue Tylenol ibuprofen as needed.  Mouth rinsing and supportive care.  Encouraged to follow-up with dentist as soon as possible.  Also counseled to monitor blood pressure is elevated in urgent care visit, patient states will continue to monitor.  Discussed indication, risks and benefits of medications with patient. Discussed follow up and return parameters including no resolution or any worsening concerns. Patient verbalized understanding and agreed to plan.   ____________________________________________   FINAL CLINICAL IMPRESSION(S) / ED DIAGNOSES  Final diagnoses:  Dental infection     ED Discharge Orders         Ordered    amoxicillin (AMOXIL) 875 MG tablet  2 times daily     03/10/20 0919           Note: This dictation was prepared with Dragon dictation along with smaller phrase technology. Any transcriptional errors that result from this process are unintentional.         Marylene Land, NP 03/10/20 1014

## 2020-09-27 ENCOUNTER — Ambulatory Visit
Admission: EM | Admit: 2020-09-27 | Discharge: 2020-09-27 | Disposition: A | Discharge status: Home / Self Care | Payer: Commercial Managed Care - PPO

## 2020-09-27 ENCOUNTER — Other Ambulatory Visit: Payer: Self-pay

## 2020-09-27 DIAGNOSIS — K0889 Other specified disorders of teeth and supporting structures: Secondary | ICD-10-CM | POA: Diagnosis not present

## 2020-09-27 MED ORDER — AMOXICILLIN-POT CLAVULANATE 875-125 MG PO TABS: 1.0000 | ORAL_TABLET | Freq: Two times a day (BID) | ORAL | 0 refills | Status: AC

## 2020-09-27 MED ORDER — HYDROCODONE-ACETAMINOPHEN 5-325 MG PO TABS: 2.0000 | ORAL_TABLET | ORAL | 0 refills | Status: DC | PRN

## 2020-09-27 NOTE — ED Provider Notes (Signed)
[facility]    CSN: [id number] Arrival date & time: 09/27/20  1924      History   Chief Complaint Chief Complaint  Patient presents with  . Dental Pain    HPI Phillip Palmer is a 39 y.o. male.   HPI   39 year old male here for evaluation of dental pain.  Patient reports that he was leaving work [employer] and noticed he started having pain in his right jaw that radiated to his ear and upper cheek.  He cannot tell if the pain is in his upper or lower jaw.  Patient has a lot of broken and rotten teeth in his mouth that he has not been able to afford dental care for.  History reviewed. No pertinent past medical history.  There are no problems to display for this patient.   History reviewed. No pertinent surgical history.     Home Medications    Prior to Admission medications   Medication Sig Start Date End Date Taking? Authorizing Provider  Chorionic Gonadotropin (HCG) 6000 units SOLR Inject as directed.   Yes [provider]  Testosterone Cypionate 100 MG/ML SOLN Inject 0.5 mLs as directed.   Yes [provider]  amoxicillin-clavulanate (AUGMENTIN) 875-125 MG tablet Take 1 tablet by mouth every 12 (twelve) hours for 10 days. 09/27/20 10/07/20  Becky Augusta, NP  HYDROcodone-acetaminophen (NORCO/VICODIN) 5-325 MG tablet Take 2 tablets by mouth every 4 (four) hours as needed. 09/27/20   Becky Augusta, NP  ibuprofen (ADVIL) 200 MG tablet Take 200 mg by mouth every 6 (six) hours as needed.    [provider]    Family History Family History  Problem Relation Age of Onset  . Multiple sclerosis Mother   . Alcohol abuse Father     Social History Social History   Tobacco Use  . Smoking status: Current Every Day Smoker    Packs/day: 1.00    Years: 21.00    Pack years: 21.00  . Smokeless tobacco: Never Used  Vaping Use  . Vaping Use: Never used  Substance Use Topics  . Alcohol use: No  . Drug use: Never     Allergies     Shellfish allergy   Review of Systems Review of Systems  Constitutional: Negative for activity change and fever.  HENT: Positive for dental problem, rhinorrhea and sinus pain. Negative for ear pain and sinus pressure.   Respiratory: Negative for cough and shortness of breath.   Cardiovascular: Negative for chest pain.  Gastrointestinal: Positive for nausea.  Musculoskeletal: Negative for arthralgias and myalgias.  Skin: Negative.   Neurological: Negative for headaches.  Hematological: Negative.   Psychiatric/Behavioral: Negative.      Physical Exam Triage Vital Signs ED Triage Vitals  Enc Vitals Group     BP 09/27/20 2041 (!) 153/96     Pulse Rate 09/27/20 2041 82     Resp 09/27/20 2041 16     Temp 09/27/20 2041 98.3 F (36.8 C)     Temp Source 09/27/20 2041 Oral     SpO2 09/27/20 2041 99 %     Weight 09/27/20 2039 199 lb (90.3 kg)     Height 09/27/20 2039 5\' 5"  (1.651 m)     Head Circumference --      Peak Flow --      Pain Score 09/27/20 2039 7     Pain Loc --      Pain Edu? --      Excl. in GC? --  No data found.  Updated Vital Signs BP (!) 153/96 (BP Location: Right Arm)   Pulse 82   Temp 98.3 F (36.8 C) (Oral)   Resp 16   Ht 5\' 5"  (1.651 m)   Wt 199 lb (90.3 kg)   SpO2 99%   BMI 33.12 kg/m   Visual Acuity Right Eye Distance:   Left Eye Distance:   Bilateral Distance:    Right Eye Near:   Left Eye Near:    Bilateral Near:     Physical Exam Vitals and nursing note reviewed.  Constitutional:      General: He is not in acute distress.    Appearance: Normal appearance. He is normal weight.  HENT:     Head: Normocephalic and atraumatic.     Mouth/Throat:     Mouth: Mucous membranes are moist.     Pharynx: Posterior oropharyngeal erythema present. No oropharyngeal exudate.     Comments: Patient has erythema of both upper and lower gums.  Patient has multiple teeth that are broken off to the gumline with black nubs remaining. Eyes:      General: No scleral icterus.    Extraocular Movements: Extraocular movements intact.     Conjunctiva/sclera: Conjunctivae normal.     Pupils: Pupils are equal, round, and reactive to light.  Musculoskeletal:        General: No swelling. Normal range of motion.  Skin:    General: Skin is warm and dry.     Capillary Refill: Capillary refill takes less than 2 seconds.     Findings: No erythema.  Neurological:     General: No focal deficit present.     Mental Status: He is alert and oriented to person, place, and time.  Psychiatric:        Mood and Affect: Mood normal.        Behavior: Behavior normal.        Thought Content: Thought content normal.        Judgment: Judgment normal.      UC Treatments / Results  Labs (all labs ordered are listed, but only abnormal results are displayed) Labs Reviewed - No data to display  EKG   Radiology No results found.  Procedures Procedures (including critical care time)  Medications Ordered in UC Medications - No data to display  Initial Impression / Assessment and Plan / UC Course  I have reviewed the triage vital signs and the nursing notes.  Pertinent labs & imaging results that were available during my care of the patient were reviewed by me and considered in my medical decision making (see chart for details).   Patient has erythema of both upper and lower gums.  No bogginess to the soft or hard palate or the floor of the mouth.  No pus visible from any of the nubs of broken teeth.  None of the teeth are particularly tender to palpation or percussion.  Will cover patient with Augmentin twice daily x10 days and give Norco for pain.   Final Clinical Impressions(s) / UC Diagnoses   Final diagnoses:  Pain, dental     Discharge Instructions     Take the Augmentin twice daily with food for 10 days.  Use the Norco, 1 to 2 tablets every 6 hours as needed for severe pain.  Use ibuprofen as needed for mild to moderate  pain.  If you cannot get into see a dentist you can try the dental urgent care at the [facility]  in [place].  They open at 9 AM.    ED Prescriptions    Medication Sig Dispense Auth. Provider   amoxicillin-clavulanate (AUGMENTIN) 875-125 MG tablet Take 1 tablet by mouth every 12 (twelve) hours for 10 days. 20 tablet Becky Augusta, NP   HYDROcodone-acetaminophen (NORCO/VICODIN) 5-325 MG tablet Take 2 tablets by mouth every 4 (four) hours as needed. 10 tablet Becky Augusta, NP     I have reviewed the PDMP during this encounter.   Becky Augusta, NP 09/27/20 2107

## 2020-09-27 NOTE — ED Triage Notes (Signed)
Patient complains of upper right jaw pain that started today. States that he took some ibuprofen to try and help. States that area has been painful, started on the ride home today.

## 2020-09-27 NOTE — Discharge Instructions (Addendum)
Take the Augmentin twice daily with food for 10 days.  Use the Norco, 1 to 2 tablets every 6 hours as needed for severe pain.  Use ibuprofen as needed for mild to moderate pain.  If you cannot get into see a dentist you can try the dental urgent care at the [facility] in [place].  They open at 9 AM.

## 2021-02-07 ENCOUNTER — Encounter: Payer: Self-pay | Admitting: Emergency Medicine

## 2021-02-07 ENCOUNTER — Emergency Department: Payer: Commercial Managed Care - PPO

## 2021-02-07 ENCOUNTER — Other Ambulatory Visit: Payer: Self-pay

## 2021-02-07 ENCOUNTER — Emergency Department
Admission: EM | Admit: 2021-02-07 | Discharge: 2021-02-07 | Disposition: A | Discharge status: Home / Self Care | Payer: Commercial Managed Care - PPO | Attending: Emergency Medicine | Admitting: Emergency Medicine

## 2021-02-07 DIAGNOSIS — R1013 Epigastric pain: Secondary | ICD-10-CM | POA: Insufficient documentation

## 2021-02-07 DIAGNOSIS — F17200 Nicotine dependence, unspecified, uncomplicated: Secondary | ICD-10-CM | POA: Insufficient documentation

## 2021-02-07 DIAGNOSIS — R0602 Shortness of breath: Secondary | ICD-10-CM | POA: Insufficient documentation

## 2021-02-07 DIAGNOSIS — R072 Precordial pain: Secondary | ICD-10-CM | POA: Diagnosis present

## 2021-02-07 HISTORY — DX: Gastro-esophageal reflux disease without esophagitis: K21.9

## 2021-02-07 LAB — COMPREHENSIVE METABOLIC PANEL
ALT: 28 U/L (ref 0–44)
AST: 27 U/L (ref 15–41)
Albumin: 4.4 g/dL (ref 3.5–5.0)
Alkaline Phosphatase: 71 U/L (ref 38–126)
Anion gap: 9 (ref 5–15)
BUN: 16 mg/dL (ref 6–20)
CO2: 27 mmol/L (ref 22–32)
Calcium: 9.5 mg/dL (ref 8.9–10.3)
Chloride: 102 mmol/L (ref 98–111)
Creatinine, Ser: 0.96 mg/dL (ref 0.61–1.24)
GFR, Estimated: >60 mL/min (ref >60)
Glucose, Bld: 83 mg/dL (ref 70–99)
Potassium: 3.7 mmol/L (ref 3.5–5.1)
Sodium: 138 mmol/L (ref 135–145)
Total Bilirubin: 0.8 mg/dL (ref 0.3–1.2)
Total Protein: 7 g/dL (ref 6.5–8.1)

## 2021-02-07 LAB — LIPASE, BLOOD: Lipase: 32 U/L (ref 11–51)

## 2021-02-07 LAB — CBC
HCT: 46.3 % (ref 39.0–52.0)
Hemoglobin: 16.7 g/dL (ref 13.0–17.0)
MCH: 31.5 pg (ref 26.0–34.0)
MCHC: 36.1 g/dL — ABNORMAL HIGH (ref 30.0–36.0)
MCV: 87.2 fL (ref 80.0–100.0)
Platelets: 329 10*3/uL (ref 150–400)
RBC: 5.31 MIL/uL (ref 4.22–5.81)
RDW: 12.4 % (ref 11.5–15.5)
WBC: 14.5 10*3/uL — ABNORMAL HIGH (ref 4.0–10.5)
nRBC: 0 % (ref 0.0–0.2)

## 2021-02-07 LAB — TROPONIN I (HIGH SENSITIVITY)
Troponin I (High Sensitivity): 5 ng/L (ref <18)
Troponin I (High Sensitivity): 4 ng/L (ref <18)

## 2021-02-07 LAB — D-DIMER, QUANTITATIVE: D-Dimer, Quant: 0.48 ug/mL-FEU (ref 0.00–0.50)

## 2021-02-07 MED ORDER — SODIUM CHLORIDE 0.9 % IV BOLUS
500.0000 mL | Freq: Once | INTRAVENOUS | Status: AC
  Administered 2021-02-07: 500 mL via INTRAVENOUS

## 2021-02-07 NOTE — ED Notes (Signed)
Lab called for add on 

## 2021-02-07 NOTE — Discharge Instructions (Addendum)
No signs of a heart attack or blood clot.  Your ultrasound is as below.  I do not think your pain is from the gallbladder but if you continue to have any pain in your right upper quadrant after eating fatty greasy foods you can follow-up with surgery team.  You can also follow-up with the cardiology team due to you having risk factors for heart disease.   Return to the ER if you develop worsening pain or any other concerns   IMPRESSION:  1. Contracted gallbladder with slight increased wall thickness but  no other suspicious sonographic features to suggest acute  gallbladder disease.  2. Echogenic liver consistent with steatosis

## 2021-02-07 NOTE — ED Triage Notes (Addendum)
Pt c/o of sharp central CP approx 45 min after eating, pt denies radiation or N/V/dizziness/SOB, no cardiac hx  Pt took TUMS approx midnight  Hx of HTN and GERD

## 2021-02-07 NOTE — ED Provider Notes (Signed)
[facility] Emergency Department Provider Note  ____________________________________________   Event Date/Time   First MD Initiated Contact with Patient 02/07/21 [phone]     (approximate)  I have reviewed the triage vital signs and the nursing notes.   HISTORY  Chief Complaint Chest Pain    HPI Phillip Palmer is a 40 y.o. male with GERD who comes in with chest pain.  Patient reports having sharp central chest pain approximately 45 minutes after eating.  Patient reports sharp stabbing midsternal chest pain lasting for a few seconds coming and going after eating.  Patient states that the pain is now resolved.  He did take a Tums.  Denies ever having this previously.  Has a history of acid reflux but states that this feels differently.  Patient does report some baseline shortness of breath secondary to smoking history.  He reports being well-hydrated.  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  Patient does smoke          Past Medical History:  Diagnosis Date  . GERD (gastroesophageal reflux disease)     There are no problems to display for this patient.   History reviewed. No pertinent surgical history.  Prior to Admission medications   Medication Sig Start Date End Date Taking? Authorizing Provider  Chorionic Gonadotropin (HCG) 6000 units SOLR Inject as directed.    [provider]  HYDROcodone-acetaminophen (NORCO/VICODIN) 5-325 MG tablet Take 2 tablets by mouth every 4 (four) hours as needed. 09/27/20   Becky Augusta, NP  ibuprofen (ADVIL) 200 MG tablet Take 200 mg by mouth every 6 (six) hours as needed.    [provider]  Testosterone Cypionate 100 MG/ML SOLN Inject 0.5 mLs as directed.    [provider]    Allergies Shellfish allergy  Family History  Problem Relation Age of Onset  . Multiple sclerosis Mother   . Alcohol  abuse Father     Social History Social History   Tobacco Use  . Smoking status: Current Every Day Smoker    Packs/day: 1.00    Years: 21.00    Pack years: 21.00  . Smokeless tobacco: Never Used  Vaping Use  . Vaping Use: Never used  Substance Use Topics  . Alcohol use: No  . Drug use: Never      Review of Systems Constitutional: No fever/chills Eyes: No visual changes. ENT: No sore throat. Cardiovascular: Positive chest pain Respiratory: Denies shortness of breath. Gastrointestinal: No abdominal pain.  No nausea, no vomiting.  No diarrhea.  No constipation. Genitourinary: Negative for dysuria. Musculoskeletal: Negative for back pain. Skin: Negative for rash. Neurological: Negative for headaches, focal weakness or numbness. All other ROS negative ____________________________________________   PHYSICAL EXAM:  VITAL SIGNS: ED Triage Vitals  Enc Vitals Group     BP 02/07/21 0051 (!) 157/100     Pulse Rate 02/07/21 0051 95     Resp 02/07/21 0051 20     Temp 02/07/21 0051 98 F (36.7 C)     Temp Source 02/07/21 0051 Oral     SpO2 02/07/21 0051 98 %     Weight 02/07/21 0043 198 lb 6.6 oz (90 kg)     Height 02/07/21 0043 5\' 2"  (1.575 m)     Head Circumference --      Peak Flow --      Pain Score 02/07/21 0043 2  Pain Loc --      Pain Edu? --      Excl. in GC? --     Constitutional: Alert and oriented. Well appearing and in no acute distress. Eyes: Conjunctivae are normal. EOMI. Head: Atraumatic. Nose: No congestion/rhinnorhea. Mouth/Throat: Mucous membranes are moist.   Neck: No stridor. Trachea Midline. FROM Cardiovascular: Tachycardic, regular rhythm. Grossly normal heart sounds.  Good peripheral circulation. Respiratory: Normal respiratory effort.  No retractions. Lungs CTAB. Gastrointestinal: Soft and nontender. No distention. No abdominal bruits.  Musculoskeletal: No lower extremity tenderness nor edema.  No joint effusions. Neurologic:  Normal  speech and language. No gross focal neurologic deficits are appreciated.  Skin:  Skin is warm, dry and intact. No rash noted. Psychiatric: Mood and affect are normal. Speech and behavior are normal. GU: Deferred   ____________________________________________   LABS (all labs ordered are listed, but only abnormal results are displayed)  Labs Reviewed  CBC - Abnormal; Notable for the following components:      Result Value   WBC 14.5 (*)    MCHC 36.1 (*)    All other components within normal limits  COMPREHENSIVE METABOLIC PANEL  LIPASE, BLOOD  D-DIMER, QUANTITATIVE  TROPONIN I (HIGH SENSITIVITY)  TROPONIN I (HIGH SENSITIVITY)   ____________________________________________   ED ECG REPORT I, Concha Se, the attending physician, personally viewed and interpreted this ECG.  Sinus tachycardia rate of 110, no ST elevation T wave inversion in lead III, left anterior fascicular block ____________________________________________  RADIOLOGY Vela Prose, personally viewed and evaluated these images (plain radiographs) as part of my medical decision making, as well as reviewing the written report by the radiologist.  ED MD interpretation: No pneumonia  Official radiology report(s): DG Chest Portable 1 View  Result Date: 02/07/2021 CLINICAL DATA:  Chest pain EXAM: PORTABLE CHEST 1 VIEW COMPARISON:  None. FINDINGS: The heart size and mediastinal contours are within normal limits. Both lungs are clear. The visualized skeletal structures are unremarkable. IMPRESSION: No active disease. Electronically Signed   By: Jasmine Pang M.D.   On: 02/07/2021 01:01    ____________________________________________   PROCEDURES  Procedure(s) performed (including Critical Care):  .1-3 Lead EKG Interpretation Performed by: Concha Se, MD Authorized by: Concha Se, MD     Interpretation: abnormal     ECG rate:  90-110   ECG rate assessment: tachycardic     Rhythm: sinus tachycardia      Ectopy: none     Conduction: normal       ____________________________________________   INITIAL IMPRESSION / ASSESSMENT AND PLAN / ED COURSE   Phillip Palmer was evaluated in Emergency Department on 02/07/2021 for the symptoms described in the history of present illness. He was evaluated in the context of the global COVID-19 pandemic, which necessitated consideration that the patient might be at risk for infection with the SARS-CoV-2 virus that causes COVID-19. Institutional protocols and algorithms that pertain to the evaluation of patients at risk for COVID-19 are in a state of rapid change based on information released by regulatory bodies including the CDC and federal and state organizations. These policies and algorithms were followed during the patient's care in the ED.    Most Likely DDx:  -We will get EKG cardiac markers to evaluate for ACS.  Given his pain is midsternal just above his epigastric region and happened after eating consider the possibility of gallbladder pathology so we will get CMP and lipase as well as ultrasound to evaluate for  biliary colic.  Patient does have unexplained tachycardia and does drive frequently so also get D-dimer to evaluate for PE given his low Wells score.   DDx that was also considered d/t potential to cause harm, but was found less likely based on history and physical (as detailed above): -PNA (no fevers, cough but CXR to evaluate) -PNX (reassured with equal b/l breath sounds, CXR to evaluate) -Symptomatic anemia (will get H&H) -Aortic Dissection as no tearing pain and no radiation to the mid back, pulses equal -Pericarditis no rub on exam, EKG changes or hx to suggest dx -Tamponade (no notable SOB, tachycardic, hypotensive) -Esophageal rupture (no h/o diffuse vomitting/no crepitus)  At this time patient denies pain and declines pain medication.  We will give some IV fluid for his tachycardia  Labs are reassuring.  Patient does have  slight white count elevation but denied any infectious symptoms and chest x-ray is without evidence of pneumonia.  His D-dimer was negative therefore making PE less likely.  His initial chart cardiac marker was negative but repeat was done due to the onset of symptoms his ultrasound does show signs of steatosis and a contracted gallbladder with some gallbladder wall thickness but no other signs of acute gallbladder disease.  At this time he has no right upper quadrant tenderness so I think it is less likely to be related to his gallbladder.  We will give him surgery follow-up in case he continues to have symptoms after eating fatty foods he may need additional testing.  There is also signs of steatosis which I did discuss with patient he can follow that up with his primary doctor.  3:46 AM reevaluated patient.  Heart rates have normalized patient continued to have no symptoms since coming to the ER.  Patient has low heart score.  Will get patient surgery follow-up as well as cardiology follow-up and we discussed return to the ER if develops worsening symptoms or any other concerns     ____________________________________________   FINAL CLINICAL IMPRESSION(S) / ED DIAGNOSES   Final diagnoses:  Epigastric abdominal pain     MEDICATIONS GIVEN DURING THIS VISIT:  Medications  sodium chloride 0.9 % bolus 500 mL (0 mLs Intravenous Stopped 02/07/21 0309)     ED Discharge Orders    None       Note:  This document was prepared using Dragon voice recognition software and may include unintentional dictation errors.   Concha Se, MD 02/07/21 [phone]

## 2021-11-01 ENCOUNTER — Other Ambulatory Visit: Payer: Self-pay

## 2021-11-01 ENCOUNTER — Ambulatory Visit (INDEPENDENT_AMBULATORY_CARE_PROVIDER_SITE_OTHER): Payer: Commercial Managed Care - PPO

## 2021-11-01 ENCOUNTER — Ambulatory Visit
Admission: EM | Admit: 2021-11-01 | Discharge: 2021-11-01 | Disposition: A | Discharge status: Home / Self Care | Payer: Commercial Managed Care - PPO

## 2021-11-01 DIAGNOSIS — M25511 Pain in right shoulder: Secondary | ICD-10-CM

## 2021-11-01 DIAGNOSIS — M25562 Pain in left knee: Secondary | ICD-10-CM | POA: Diagnosis not present

## 2021-11-01 DIAGNOSIS — Y93B3 Activity, free weights: Secondary | ICD-10-CM

## 2021-11-01 MED ORDER — MELOXICAM 15 MG PO TABS: 15.0000 mg | ORAL_TABLET | Freq: Every day | ORAL | 1 refills | Status: AC

## 2021-11-01 NOTE — Discharge Instructions (Addendum)
-  Your x-rays look good.  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 - Avoid any overhead lifting until your shoulder pain improves.  I have sent an anti-inflammatory and Acacian up with pain.  You should also apply ice or heat to the area to help and work on stretches. - When you start to lift again, make sure you stretch before and after and start with low weights. -Avoid any painful exercises. - Follow-up with Ortho if not improving.

## 2021-11-01 NOTE — ED Provider Notes (Signed)
[facility]    CSN: [id number] Arrival date & time: 11/01/21  1651      History   Chief Complaint Chief Complaint  Patient presents with   Knee Pain   Shoulder Pain    HPI Phillip Palmer is a 41 y.o. male he raises his arm overhead.  States that he has been getting back to working out and has been lifting weights.  Patient says it feels like a "pinch."  Patient says when he has not lifting weights the pain improves.  He is able to raise his right arm above his head but says it hurts.  No radiation of pain down the arm and no numbness, tingling or weakness.  No history of problems with the shoulder.  He also reports left knee discomfort.  He does not describe it as a pain.  He says when he is doing knee extension exercises he feels like his knee is popping out of place.  He says it does not hurt when this happened.  He reports no pain of the knee right now.  No difficulty walking.  No radiation of pain down the leg.  No numbness, weakness or tingling.  No back pain.  Patient has occasionally taken Advil which has helped his symptoms.  HPI  Past Medical History:  Diagnosis Date   GERD (gastroesophageal reflux disease)     There are no problems to display for this patient.   History reviewed. No pertinent surgical history.     Home Medications    Prior to Admission medications   Medication Sig Start Date End Date Taking? Authorizing Provider  ibuprofen (ADVIL) 200 MG tablet Take 200 mg by mouth every 6 (six) hours as needed.   Yes [provider]  meloxicam (MOBIC) 15 MG tablet Take 1 tablet (15 mg total) by mouth daily for 15 days. 11/01/21 11/16/21 Yes Laurene Footman B, PA-C  sildenafil (REVATIO) 20 MG tablet Take 20 mg by mouth 3 (three) times daily as needed.   Yes [provider]  Chorionic Gonadotropin (HCG) 6000 units SOLR Inject as directed.    [provider]  HYDROcodone-acetaminophen (NORCO/VICODIN) 5-325 MG tablet Take 2 tablets  by mouth every 4 (four) hours as needed. 09/27/20   Margarette Canada, NP  Testosterone Cypionate 100 MG/ML SOLN Inject 0.5 mLs as directed.    [provider]    Family History Family History  Problem Relation Age of Onset   Multiple sclerosis Mother    Alcohol abuse Father     Social History Social History   Tobacco Use   Smoking status: Every Day    Packs/day: 1.00    Years: 21.00    Pack years: 21.00    Types: Cigarettes   Smokeless tobacco: Never  Vaping Use   Vaping Use: Never used  Substance Use Topics   Alcohol use: No   Drug use: Never     Allergies   Shellfish allergy   Review of Systems Review of Systems  Musculoskeletal:  Positive for arthralgias. Negative for back pain, gait problem, joint swelling and neck pain.  Skin:  Negative for color change and wound.  Neurological:  Negative for weakness and numbness.    Physical Exam Triage Vital Signs ED Triage Vitals  Enc Vitals Group     BP 11/01/21 1746 (!) 155/89     Pulse Rate 11/01/21 1746 100     Resp 11/01/21 1746 16     Temp 11/01/21 1746 98.1 F (36.7  C)     Temp Source 11/01/21 1746 Oral     SpO2 11/01/21 1746 96 %     Weight 11/01/21 1748 205 lb (93 kg)     Height 11/01/21 1748 5\' 5"  (1.651 m)     Head Circumference --      Peak Flow --      Pain Score 11/01/21 1748 5     Pain Loc --      Pain Edu? --      Excl. in [place]? --    No data found.  Updated Vital Signs BP (!) 155/89 (BP Location: Right Arm)    Pulse 100    Temp 98.1 F (36.7 C) (Oral)    Resp 16    Ht 5\' 5"  (1.651 m)    Wt 205 lb (93 kg)    SpO2 96%    BMI 34.11 kg/m      Physical Exam Vitals and nursing note reviewed.  Constitutional:      General: He is not in acute distress.    Appearance: Normal appearance. He is well-developed. He is not ill-appearing.  HENT:     Head: Normocephalic and atraumatic.  Eyes:     General: No scleral icterus.    Conjunctiva/sclera: Conjunctivae normal.  Cardiovascular:      Rate and Rhythm: Normal rate and regular rhythm.     Heart sounds: Normal heart sounds.  Pulmonary:     Effort: Pulmonary effort is normal. No respiratory distress.     Breath sounds: Normal breath sounds.  Musculoskeletal:     Cervical back: Neck supple.     Comments: L KNEE: No swelling, effusion, laceration, ecchymosis or erythema.  No tenderness to palpation of any part of the knee.  Full range of motion of knee and good strength and sensation.  R SHOULDER: No swelling, effusion, laceration, ecchymosis or erythema.  Tenderness to palpation of the posterior shoulder and periscapular region.  Full range of motion of the shoulder but has pain beyond 90 degrees of abduction and flexion.  Good strength and sensation throughout.  Also, no tenderness palpation of any part of the neck and full range of motion of neck without pain.  Skin:    General: Skin is warm and dry.     Capillary Refill: Capillary refill takes less than 2 seconds.  Neurological:     General: No focal deficit present.     Mental Status: He is alert. Mental status is at baseline.     Motor: No weakness.     Gait: Gait normal.  Psychiatric:        Mood and Affect: Mood normal.        Behavior: Behavior normal.        Thought Content: Thought content normal.     UC Treatments / Results  Labs (all labs ordered are listed, but only abnormal results are displayed) Labs Reviewed - No data to display  EKG   Radiology No results found.  Procedures Procedures (including critical care time)  Medications Ordered in UC Medications - No data to display  Initial Impression / Assessment and Plan / UC Course  I have reviewed the triage vital signs and the nursing notes.  Pertinent labs & imaging results that were available during my care of the patient were reviewed by me and considered in my medical decision making (see chart for details).  41 year old male presenting for right shoulder pain and left knee discomfort.   Denies any injuries.  Symptoms have started after he has gotten back to lifting weights and working out.  Patient reports he is not lifting any heavy weights.  Normal knee exam.  X-ray of knee is normal.  Of the right shoulder, there is tenderness to palpation of the posterior shoulder and periscapular region.  Full range of motion of shoulder but has pain with 90 degrees of abduction and flexion.  X-ray of shoulder shows minimal AC joint changes, otherwise normal.  Discussed results of x-rays with patient.  I do not suspect any serious conditions affecting the shoulder or knee.  However, I advised patient of the shoulder pain is not getting better with avoiding painful movements including lifting overhead, or it worsens, he may need to go to orthopedics and have an MRI of the shoulder to rule out any ligament tears.  Could be sliding tendon with the knee.  Advised him to hold off the knee extension exercises at this time.  Apparently, he does not have any difficulty with any other lower body exercises.  Sent meloxicam.  Reviewed RICE guidelines.  Follow-up as needed.  Final Clinical Impressions(s) / UC Diagnoses   Final diagnoses:  Acute pain of right shoulder  Acute pain of left knee     Discharge Instructions      -Your x-rays look good.  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 - Avoid any overhead lifting until your shoulder pain improves.  I have sent an anti-inflammatory and Acacian up with pain.  You should also apply ice or heat to the area to help and work on stretches. - When you start to lift again, make sure you stretch before and after and start with low weights. -Avoid any painful exercises. - Follow-up with Ortho if not improving.     ED Prescriptions     Medication Sig Dispense Auth. Provider   meloxicam (MOBIC) 15 MG tablet Take 1 tablet (15 mg total) by mouth daily for 15 days. 15 tablet Gretta Cool      PDMP not reviewed this encounter.    Laurene Footman B, PA-C 11/04/21 0800

## 2021-11-01 NOTE — ED Triage Notes (Signed)
Pt reports right shoulder pain "like a pinch", for several weeks, pt reports started lighting weights a month ago, took one week break, with slight improvement.   DROM to right arm, cannot lift right arm above his head  Also, reports left knee discomfort for past week, with movement pt reports "grinding, tendon movement", feels like "kneecaps moves" at times.  FROM to left leg, able to ambulate w/o difficulty. Has had issues in past with left knee  OTC advil intermittently

## 2021-11-15 ENCOUNTER — Emergency Department
Admission: EM | Admit: 2021-11-15 | Discharge: 2021-11-15 | Disposition: A | Discharge status: Home / Self Care | Payer: Commercial Managed Care - PPO | Attending: Student in an Organized Health Care Education/Training Program | Admitting: Student in an Organized Health Care Education/Training Program

## 2021-11-15 ENCOUNTER — Other Ambulatory Visit: Payer: Self-pay

## 2021-11-15 ENCOUNTER — Emergency Department: Payer: Commercial Managed Care - PPO

## 2021-11-15 ENCOUNTER — Encounter: Payer: Self-pay | Admitting: Emergency Medicine

## 2021-11-15 ENCOUNTER — Ambulatory Visit
Admission: EM | Admit: 2021-11-15 | Discharge: 2021-11-15 | Disposition: A | Discharge status: Home / Self Care | Payer: Commercial Managed Care - PPO | Attending: Physician Assistant | Admitting: Physician Assistant

## 2021-11-15 DIAGNOSIS — R079 Chest pain, unspecified: Secondary | ICD-10-CM

## 2021-11-15 DIAGNOSIS — R0789 Other chest pain: Secondary | ICD-10-CM | POA: Insufficient documentation

## 2021-11-15 DIAGNOSIS — R030 Elevated blood-pressure reading, without diagnosis of hypertension: Secondary | ICD-10-CM | POA: Diagnosis not present

## 2021-11-15 LAB — BASIC METABOLIC PANEL
Anion gap: 10 (ref 5–15)
BUN: 12 mg/dL (ref 6–20)
CO2: 24 mmol/L (ref 22–32)
Calcium: 9.9 mg/dL (ref 8.9–10.3)
Chloride: 104 mmol/L (ref 98–111)
Creatinine, Ser: 0.77 mg/dL (ref 0.61–1.24)
GFR, Estimated: >60 mL/min (ref >60)
Glucose, Bld: 109 mg/dL — ABNORMAL HIGH (ref 70–99)
Potassium: 3.8 mmol/L (ref 3.5–5.1)
Sodium: 138 mmol/L (ref 135–145)

## 2021-11-15 LAB — CBC
HCT: 49 % (ref 39.0–52.0)
Hemoglobin: 17.4 g/dL — ABNORMAL HIGH (ref 13.0–17.0)
MCH: 31.2 pg (ref 26.0–34.0)
MCHC: 35.5 g/dL (ref 30.0–36.0)
MCV: 88 fL (ref 80.0–100.0)
Platelets: 264 10*3/uL (ref 150–400)
RBC: 5.57 MIL/uL (ref 4.22–5.81)
RDW: 12.2 % (ref 11.5–15.5)
WBC: 10.9 10*3/uL — ABNORMAL HIGH (ref 4.0–10.5)
nRBC: 0 % (ref 0.0–0.2)

## 2021-11-15 LAB — TROPONIN I (HIGH SENSITIVITY)
Troponin I (High Sensitivity): 4 ng/L (ref <18)
Troponin I (High Sensitivity): 4 ng/L (ref <18)

## 2021-11-15 MED ORDER — ALBUTEROL SULFATE HFA 108 (90 BASE) MCG/ACT IN AERS: 2.0000 | INHALATION_SPRAY | Freq: Four times a day (QID) | RESPIRATORY_TRACT | 2 refills | Status: DC | PRN

## 2021-11-15 MED ORDER — PREDNISONE 20 MG PO TABS: 40.0000 mg | ORAL_TABLET | Freq: Every day | ORAL | 0 refills | Status: AC

## 2021-11-15 MED ORDER — ASPIRIN 81 MG PO CHEW
324.0000 mg | CHEWABLE_TABLET | Freq: Once | ORAL | Status: AC
  Administered 2021-11-15: 324 mg via ORAL

## 2021-11-15 NOTE — ED Notes (Signed)
Pt sts that he has been having upper bilat chest pain that has been happening in the evenings in the pec area. Pt sts it feels like a muscle shock. Pt sts that he presses on it and has no pain but it just feels tingles. Pt denies any SOB or CP at this time, skin is pink, warm and dry. A/Ox4.

## 2021-11-15 NOTE — ED Triage Notes (Signed)
Pt stated that he had a shoulder issue in the past and was put on Meloxicam. Pt works out heavy and has been experiencing chest pain.    Pt states that an hour ago, he had a sharp pain in his lower left chest that extended down to his left hand.

## 2021-11-15 NOTE — Discharge Instructions (Addendum)
Recommend EMS transport for chest pain, pt declines wants to go POV, Go straight to ER for evaluation of chest pain, do not eat or drink anything. Must let ER know that you took Viagra today.

## 2021-11-15 NOTE — ED Notes (Signed)
Patient transported to X-ray 

## 2021-11-15 NOTE — ED Notes (Signed)
Patient is being discharged from the Urgent Care and sent to the Emergency Department via POV . Per Clancy Gourd, NP, patient is in need of higher level of care due to Chest pain. Patient is aware and verbalizes understanding of plan of care. Pt declined EMS and signed AMA form. Vitals:   11/15/21 1837  BP: (!) 162/108  Pulse: 93  Resp: 18  Temp: 98.1 F (36.7 C)  SpO2: 96%

## 2021-11-15 NOTE — ED Provider Notes (Signed)
[facility] Provider Note    Event Date/Time   First MD Initiated Contact with Patient 11/15/21 2132     (approximate)   History   Chest Pain   HPI  Phillip Palmer is a 41 y.o. male [name] to the ER for evaluation of chest pain intermittent over the past week.  His episode today was severe.  Bilateral chest states it taken on his left hand.  It is brief in nature.  Denies any diaphoresis.  No shortness of breath at this time.  He is pain-free.  Went to urgent care sent to the ER for further evaluation.  He does smoke but denies any history of heart disease.  Denies any pain taking deep inspiration no recent immobilization or prolonged travel.  No pain ripping or tearing through to his back.     Physical Exam   Triage Vital Signs: ED Triage Vitals  Enc Vitals Group     BP 11/15/21 1942 (!) 157/98     Pulse Rate 11/15/21 1942 89     Resp 11/15/21 1942 20     Temp 11/15/21 1942 98 F (36.7 C)     Temp Source 11/15/21 1942 Oral     SpO2 11/15/21 1942 99 %     Weight 11/15/21 1942 207 lb (93.9 kg)     Height 11/15/21 1942 5\' 5"  (1.651 m)     Head Circumference --      Peak Flow --      Pain Score 11/15/21 1948 3     Pain Loc --      Pain Edu? --      Excl. in [place]? --     Most recent vital signs: Vitals:   11/15/21 1942 11/15/21 2230  BP: (!) 157/98 (!) 128/93  Pulse: 89 78  Resp: 20   Temp: 98 F (36.7 C)   SpO2: 99% 98%     Constitutional: Alert  Eyes: Conjunctivae are normal.  Head: Atraumatic. Nose: No congestion/rhinnorhea. Mouth/Throat: Mucous membranes are moist.   Neck: Painless ROM.  Cardiovascular:   Good peripheral circulation. Respiratory: Normal respiratory effort.  No retractions.  Gastrointestinal: Soft and nontender.  Musculoskeletal:  no deformity Neurologic:  MAE spontaneously. No gross focal neurologic deficits are appreciated.  Skin:  Skin is warm, dry and intact. No rash noted. Psychiatric: Mood and affect are  normal. Speech and behavior are normal.    ED Results / Procedures / Treatments   Labs (all labs ordered are listed, but only abnormal results are displayed) Labs Reviewed  CBC - Abnormal; Notable for the following components:      Result Value   WBC 10.9 (*)    Hemoglobin 17.4 (*)    All other components within normal limits  BASIC METABOLIC PANEL - Abnormal; Notable for the following components:   Glucose, Bld 109 (*)    All other components within normal limits  TROPONIN I (HIGH SENSITIVITY)  TROPONIN I (HIGH SENSITIVITY)     EKG  .ED ECG REPORT I, Merlyn Lot, the attending physician, personally viewed and interpreted this ECG.   Date: 11/15/2021  EKG Time: 19:46  Rate: chest pain  Rhythm: sinus  Axis: normal  Intervals: normal qt  ST&T Change: no stemi, poor r wave progression    RADIOLOGY Please see ED Course for my review and interpretation.  I personally reviewed all radiographic images ordered to evaluate for the above acute complaints and reviewed radiology reports and findings.  These findings  were personally discussed with the patient.  Please see medical record for radiology report.    PROCEDURES:  Critical Care performed:   Procedures   MEDICATIONS ORDERED IN ED: Medications - No data to display   IMPRESSION / MDM / [place] / ED COURSE  I reviewed the triage vital signs and the nursing notes.                              Differential diagnosis includes, but is not limited to, ACS, pericarditis, esophagitis, boerhaaves, pe, dissection, pna, bronchitis, costochondritis  Patient presented to the ER for evaluation of chest pain as described above.  Patient is a smoker and considering his age patient placed on monitor for observation.  Will plan troponins, bloodwork and consider admission.    Troponins are negative.  Chest x-ray nonacute does not show any sign pneumothorax EKG is nonischemic.  He is low risk by Wells criteria  and is PERC negative.  Have some pain findings on exam with some signs of respiratory distress he is nonhypoxic.  This is more consistent with bronchitis.  I do believe outpatient follow-up with cardiology is clinically appropriate given his patient's presenting symptoms and risk factors.  Patient agreeable to plan      FINAL CLINICAL IMPRESSION(S) / ED DIAGNOSES   Final diagnoses:  Atypical chest pain     Rx / DC Orders   ED Discharge Orders          Ordered    albuterol (VENTOLIN HFA) 108 (90 Base) MCG/ACT inhaler  Every 6 hours PRN        11/15/21 2228    predniSONE (DELTASONE) 20 MG tablet  Daily        11/15/21 2228             Note:  This document was prepared using Dragon voice recognition software and may include unintentional dictation errors.    Merlyn Lot, MD 11/15/21 [phone]

## 2021-11-15 NOTE — ED Triage Notes (Signed)
Pt to ED from home c/o left and right chest pain tonight, states was pulsating pain and radiating left rib and left hand pain.  Denies SOB, or n/v/d.  Has had something similar that was acid reflux.  Pt A&Ox4, chest rise even and unlabored, skin WNL and in NAD at this time.

## 2021-11-15 NOTE — ED Provider Notes (Signed)
[facility]    CSN: [id number] Arrival date & time: 11/15/21  1819      History   Chief Complaint Chief Complaint  Patient presents with   Chest Pain    HPI Phillip Palmer is a 41 y.o. male.   40 year old male pt , Phillip Palmer, presents to urgent care with chief complaint of intermittent chest pain for 1 week, approximately 1 hour PTA had sharp pain left side of chest and left hand denies radiation states"nothing in between" while eating chips and popcorn. Pt denies palpitations, shortness of breath, nausea,vomiting or diaphoresis.  Pt endorses smoking 1 1/2 ppd down from 3 packs, also took Viagra earlier today. Denies family cardiac hx. Pt arrives by POV states his wife drove him.  The history is provided by the patient. No language interpreter was used.  Chest Pain Pain location:  L chest Pain quality: sharp   Pain radiates to:  L arm Pain severity:  Mild Timing:  Intermittent Progression:  Waxing and waning Chronicity:  Recurrent Context: lifting   Context comment:  Pt states he has been lifting weights, injured right shoulder recently was taking meloxicam Associated symptoms: no fever, no palpitations and no shortness of breath    Past Medical History:  Diagnosis Date   GERD (gastroesophageal reflux disease)     Patient Active Problem List   Diagnosis Date Noted   Chest pain 11/15/2021   Elevated blood pressure reading 11/15/2021    History reviewed. No pertinent surgical history.     Home Medications    Prior to Admission medications   Medication Sig Start Date End Date Taking? Authorizing Provider  Chorionic Gonadotropin (HCG) 6000 units SOLR Inject as directed.   Yes [provider]  ibuprofen (ADVIL) 200 MG tablet Take 200 mg by mouth every 6 (six) hours as needed.   Yes [provider]  meloxicam (MOBIC) 15 MG tablet Take 1 tablet (15 mg total) by mouth daily for 15 days. 11/01/21 11/16/21 Yes Eusebio Friendly B, PA-C   sildenafil (REVATIO) 20 MG tablet Take 20 mg by mouth 3 (three) times daily as needed.   Yes [provider]  HYDROcodone-acetaminophen (NORCO/VICODIN) 5-325 MG tablet Take 2 tablets by mouth every 4 (four) hours as needed. 09/27/20   Becky Augusta, NP  Testosterone Cypionate 100 MG/ML SOLN Inject 0.5 mLs as directed.    [provider]    Family History Family History  Problem Relation Age of Onset   Multiple sclerosis Mother    Alcohol abuse Father     Social History Social History   Tobacco Use   Smoking status: Every Day    Packs/day: 1.00    Years: 21.00    Pack years: 21.00    Types: Cigarettes   Smokeless tobacco: Never  Vaping Use   Vaping Use: Never used  Substance Use Topics   Alcohol use: No   Drug use: Never     Allergies   Shellfish allergy   Review of Systems Review of Systems  Constitutional:  Negative for fever.  Respiratory:  Negative for shortness of breath.   Cardiovascular:  Positive for chest pain. Negative for palpitations.  All other systems reviewed and are negative.   Physical Exam Triage Vital Signs ED Triage Vitals  Enc Vitals Group     BP --      Pulse --      Resp --      Temp --  Temp src --      SpO2 --      Weight 11/15/21 1828 207 lb (93.9 kg)     Height 11/15/21 1828 5\' 5"  (1.651 m)     Head Circumference --      Peak Flow --      Pain Score 11/15/21 1827 3     Pain Loc --      Pain Edu? --      Excl. in [place]? --    No data found.  Updated Vital Signs BP (!) 162/108 (BP Location: Left Arm)    Pulse 93    Temp 98.1 F (36.7 C) (Oral)    Resp 18    Ht 5\' 5"  (1.651 m)    Wt 207 lb (93.9 kg)    SpO2 96%    BMI 34.45 kg/m   Visual Acuity Right Eye Distance:   Left Eye Distance:   Bilateral Distance:    Right Eye Near:   Left Eye Near:    Bilateral Near:     Physical Exam Vitals and nursing note reviewed.  Constitutional:      Appearance: He is well-developed and well-groomed.  HENT:      Head: Normocephalic.     Mouth/Throat:     Lips: Pink.     Mouth: Mucous membranes are moist.     Pharynx: Oropharynx is clear.  Eyes:     General: Lids are normal.     Extraocular Movements: Extraocular movements intact.     Pupils: Pupils are equal, round, and reactive to light.  Neck:     Trachea: Trachea normal.  Cardiovascular:     Rate and Rhythm: Normal rate and regular rhythm.     Pulses: Normal pulses.     Heart sounds: Normal heart sounds.  Pulmonary:     Effort: Pulmonary effort is normal.     Breath sounds: Normal breath sounds and air entry.  Musculoskeletal:     Cervical back: Normal range of motion.  Skin:    General: Skin is warm.     Capillary Refill: Capillary refill takes less than 2 seconds.  Neurological:     General: No focal deficit present.     Mental Status: He is alert and oriented to person, place, and time.     GCS: GCS eye subscore is 4. GCS verbal subscore is 5. GCS motor subscore is 6.  Psychiatric:        Attention and Perception: Attention normal.        Mood and Affect: Mood normal.        Speech: Speech normal.        Behavior: Behavior normal. Behavior is cooperative.     UC Treatments / Results  Labs (all labs ordered are listed, but only abnormal results are displayed) Labs Reviewed - No data to display  EKG   Radiology No results found.  Procedures Procedures (including critical care time)  Medications Ordered in UC Medications  aspirin chewable tablet 324 mg (324 mg Oral Given 11/15/21 1850)    Initial Impression / Assessment and Plan / UC Course  I have reviewed the triage vital signs and the nursing notes.  Pertinent labs & imaging results that were available during my care of the patient were reviewed by me and considered in my medical decision making (see chart for details).  Clinical Course as of 11/15/21 2000  Tue Nov 15, 2021  1840 EKG obtained, NSR, rate 82, no ectopy, no STEMI  when compared to 04/22 EKG no  change noted. [JD]    Clinical Course User Index [JD] Brannen Koppen, Para MarchJeanette, NP   Pt received 4 baby ASA in urgent care, signed out AMA. Pt aware he needs to go to ER for serial troponin, cardiac monitoring and further assessment especially with hx of smoking, Bp 162/108, and viagra use, pt declines other treatment or transport at this time.   Ddx: Chest pain, ACS, chest wall pain, musculoskeletal strain, viral illness Final Clinical Impressions(s) / UC Diagnoses   Final diagnoses:  Chest pain, unspecified type  Elevated blood pressure reading     Discharge Instructions      Recommend EMS transport for chest pain, pt declines wants to go POV, Go straight to ER for evaluation of chest pain, do not eat or drink anything. Must let ER know that you took Viagra today.     ED Prescriptions   None    PDMP not reviewed this encounter.   Clancy Gourdefelice, Monti Jilek, NP 11/15/21 2001

## 2022-03-08 IMAGING — DX DG CHEST 1V PORT
1 series · 1 of 1 positions shown · non-contrast
Comparison: None.

CLINICAL DATA: Chest pain

EXAM:
PORTABLE CHEST 1 VIEW

[chest ap]
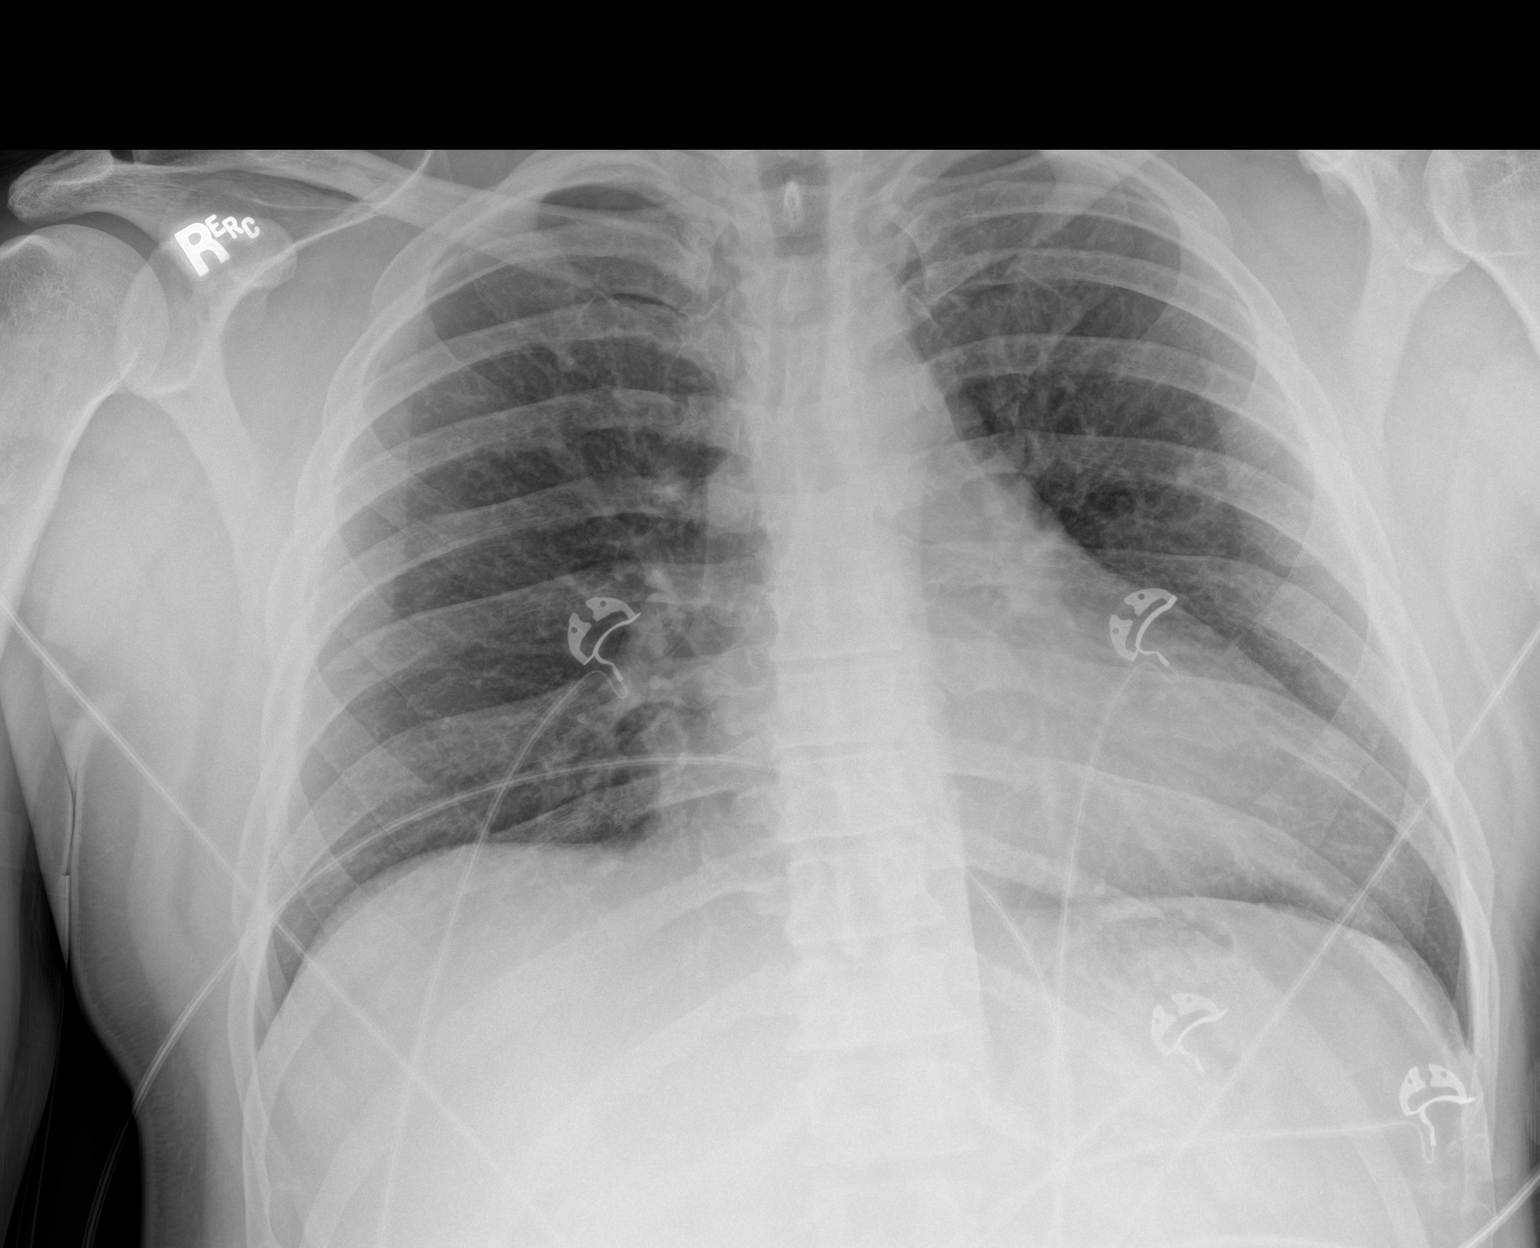

[1 of 1 positions shown; findings below may reference images not displayed]

FINDINGS: The heart size and mediastinal contours are within normal limits.
Both lungs are clear. The visualized skeletal structures are
unremarkable.
IMPRESSION: No active disease.

## 2022-05-16 ENCOUNTER — Ambulatory Visit
Admission: EM | Admit: 2022-05-16 | Discharge: 2022-05-16 | Disposition: A | Discharge status: Home / Self Care | Payer: Commercial Managed Care - PPO | Attending: Internal Medicine | Admitting: Internal Medicine

## 2022-05-16 DIAGNOSIS — H02843 Edema of right eye, unspecified eyelid: Secondary | ICD-10-CM

## 2022-05-16 DIAGNOSIS — B354 Tinea corporis: Secondary | ICD-10-CM

## 2022-05-16 MED ORDER — CLOTRIMAZOLE-BETAMETHASONE 1-0.05 % EX CREA: TOPICAL_CREAM | CUTANEOUS | 0 refills | Status: DC

## 2022-05-16 NOTE — ED Provider Notes (Signed)
[facility]    CSN: [id number] Arrival date & time: 05/16/22  0840      History   Chief Complaint Chief Complaint  Patient presents with   Eye Problem   Rash         HPI Phillip Palmer is a 41 y.o. male. He presents today with 3d hx rash recurrent on his left neck, he has had this before during hot/sweaty weather, and it responded to a topical medicine with 2 ingredients.  Rash is circular, raised, itchy, extends into area of beard.    R eye irritation started yesterday, after he was under the car doing something.  Typically wears safety glasses to do this but yesterday did not.  The upper lid was itchy and he rubbed it a good bit yesterday, puffy this morning still and more pronounced at the medial aspect of the R upper lid.  Eye does not hurt, no photophobia or blepharospasm.  No foreign body sensation.  No change in vision.  Scant crusty matter in the corner of the eye this morning on wakening.    No fever, no malaise.  Feels fine otherwise.    Eye Problem Rash   Past Medical History:  Diagnosis Date   GERD (gastroesophageal reflux disease)     Patient Active Problem List   Diagnosis Date Noted   Chest pain 11/15/2021   Elevated blood pressure reading 11/15/2021    History reviewed. No pertinent surgical history.     Home Medications    Prior to Admission medications   Medication Sig Start Date End Date Taking? Authorizing Provider  clotrimazole-betamethasone (LOTRISONE) cream Apply to affected area 2 times daily prn 05/16/22  Yes Isa Rankin, MD  ibuprofen (ADVIL) 200 MG tablet Take 200 mg by mouth every 6 (six) hours as needed.   Yes [provider]  sildenafil (REVATIO) 20 MG tablet Take 20 mg by mouth 3 (three) times daily as needed.   Yes [provider]    Family History Family History  Problem Relation Age of Onset   Multiple sclerosis Mother    Alcohol abuse Father     Social History Social History    Tobacco Use   Smoking status: Every Day    Packs/day: 1.00    Years: 21.00    Total pack years: 21.00    Types: Cigarettes   Smokeless tobacco: Never  Vaping Use   Vaping Use: Never used  Substance Use Topics   Alcohol use: No   Drug use: Never     Allergies   Shellfish allergy   Review of Systems Review of Systems  Skin:  Positive for rash.   See also HPI  Physical Exam Triage Vital Signs ED Triage Vitals  Enc Vitals Group     BP 05/16/22 0854 126/90     Pulse Rate 05/16/22 0854 75     Resp 05/16/22 0854 18     Temp 05/16/22 0854 97.7 F (36.5 C)     Temp Source 05/16/22 0854 Oral     SpO2 05/16/22 0854 97 %     Weight 05/16/22 0851 203 lb (92.1 kg)     Height 05/16/22 0851 5\' 5"  (1.651 m)     Pain Score 05/16/22 0851 0     Pain Loc --    Updated Vital Signs BP 126/90 (BP Location: Left Arm)   Pulse 75   Temp 97.7 F (36.5 C) (Oral)   Resp 18  Ht 5\' 5"  (1.651 m)   Wt 92.1 kg   SpO2 97%   BMI 33.78 kg/m   Physical Exam Constitutional:      General: He is not in acute distress.    Appearance: He is not ill-appearing.     Comments: Good hygiene  HENT:     Head: Atraumatic.     Mouth/Throat:     Mouth: Mucous membranes are moist.  Eyes:     Conjunctiva/sclera:     Right eye: Right conjunctiva is not injected. No exudate.    Left eye: Left conjunctiva is not injected. No exudate.     Comments: Conjugate gaze observed.   R upper lid is puffy diffusely, with some more focal swelling medially as diagrammed.  Not pointing, not red or tender.  No punctum or blister.  Subsiding compared to appearance on wakening today.    Cardiovascular:     Rate and Rhythm: Normal rate.  Pulmonary:     Effort: Pulmonary effort is normal. No respiratory distress.  Abdominal:     General: There is no distension.  Musculoskeletal:     Cervical back: Neck supple.     Comments: Walked into the urgent care independently  Skin:    General: Skin is warm and dry.      Comments: Raised red circular rash to left neck as in photos, with central clearing.  Scant scale appreciated.  Not tender.  Neurological:     Mental Status: He is alert.     Comments: Facial motion symmetric, speech clear/coherent/logical         UC Treatments / Results  Labs (all labs ordered are listed, but only abnormal results are displayed) Labs Reviewed - No data to display NA  EKG NA  Radiology No results found. NA  Procedures Procedures (including critical care time) NA  Medications Ordered in UC Medications - No data to display NA  Final Clinical Impressions(s) / UC Diagnoses   Final diagnoses:  Tinea corporis  Swollen eyelid, right     Discharge Instructions      No danger signs on exam today. Puffiness/irritation of right upper lid could be due to minor trauma (rubbing) yesterday, or you may have had an insect bite to the site.  Use artificial tears for irritation of the eye itself.  Ice for 5-10 minutes several times daily will help decrease puffiness of the eye lid.  Would anticipate that both will resolve over the next few days. Rash on neck appears fungal; other possibilities include an atypical bacterial infection or inflammatory skin condition.  Prescription for lotrisone cream (antifungal plus antiinflammatory) was sent to the pharmacy.  Anticipate gradual improvement over the next couple weeks.     ED Prescriptions     Medication Sig Dispense Auth. Provider   clotrimazole-betamethasone (LOTRISONE) cream Apply to affected area 2 times daily prn 15 g , MD      PDMP not reviewed this encounter.   Isa Rankin, MD 05/18/22 [phone]

## 2022-05-16 NOTE — Discharge Instructions (Addendum)
No danger signs on exam today. Puffiness/irritation of right upper lid could be due to minor trauma (rubbing) yesterday, or you may have had an insect bite to the site.  Use artificial tears for irritation of the eye itself.  Ice for 5-10 minutes several times daily will help decrease puffiness of the eye lid.  Would anticipate that both will resolve over the next few days. Rash on neck appears fungal; other possibilities include an atypical bacterial infection or inflammatory skin condition.  Prescription for lotrisone cream (antifungal plus antiinflammatory) was sent to the pharmacy.  Anticipate gradual improvement over the next couple weeks.

## 2022-05-16 NOTE — ED Triage Notes (Signed)
Pt c/o reoccuring rash along the left side of his neck x3days. Pt states that it happens when he is hot or sweating.  Pt is having right eye swelling, drainage and itching x1day  Pt denies any blurry vision or vision changes.

## 2023-08-10 ENCOUNTER — Ambulatory Visit: Payer: No Typology Code available for payment source

## 2023-08-10 ENCOUNTER — Telehealth: Payer: Self-pay | Admitting: Emergency Medicine

## 2023-08-10 ENCOUNTER — Ambulatory Visit
Admission: EM | Admit: 2023-08-10 | Discharge: 2023-08-10 | Disposition: A | Discharge status: Home / Self Care | Payer: No Typology Code available for payment source | Attending: Emergency Medicine | Admitting: Emergency Medicine

## 2023-08-10 DIAGNOSIS — R0602 Shortness of breath: Secondary | ICD-10-CM

## 2023-08-10 MED ORDER — AMOXICILLIN-POT CLAVULANATE 875-125 MG PO TABS: 1.0000 | ORAL_TABLET | Freq: Two times a day (BID) | ORAL | 0 refills | Status: DC

## 2023-08-10 MED ORDER — PREDNISONE 20 MG PO TABS: 40.0000 mg | ORAL_TABLET | Freq: Every day | ORAL | 0 refills | Status: DC

## 2023-08-10 NOTE — Telephone Encounter (Signed)
Reported chest x-ray results as negative to patient via telephone, 2 patient identifiers used, recommended continuing treatment plan as discussed with follow-up if symptoms continue to persist or worsen

## 2023-08-10 NOTE — ED Provider Notes (Signed)
[facility]    CSN: [id number] Arrival date & time: 08/10/23  1630      History   Chief Complaint Chief Complaint  Patient presents with   Aspiration    HPI Phillip Palmer is a 42 y.o. male.   Patient presents for evaluation of shortness of breath at rest exacerbated by exertion beginning today.  Has a productive cough and wheezing at baseline, has not worsened. Daily tobacco use.  Has not attempted treatment.  Endorses 1 day ago he completed CPR and his dog who became unconscious after consumption of multiple foods within his home, concern with aspiration as he had exposure to vomit, saliva and feces from dog.  Past Medical History:  Diagnosis Date   GERD (gastroesophageal reflux disease)     Patient Active Problem List   Diagnosis Date Noted   Chest pain 11/15/2021   Elevated blood pressure reading 11/15/2021    History reviewed. No pertinent surgical history.     Home Medications    Prior to Admission medications   Medication Sig Start Date End Date Taking? Authorizing Provider  clotrimazole-betamethasone (LOTRISONE) cream Apply to affected area 2 times daily prn 05/16/22  Yes Isa Rankin, MD  ibuprofen (ADVIL) 200 MG tablet Take 200 mg by mouth every 6 (six) hours as needed.   Yes [provider]  sildenafil (REVATIO) 20 MG tablet Take 20 mg by mouth 3 (three) times daily as needed.   Yes [provider]    Family History Family History  Problem Relation Age of Onset   Multiple sclerosis Mother    Alcohol abuse Father     Social History Social History   Tobacco Use   Smoking status: Every Day    Current packs/day: 1.00    Average packs/day: 1 pack/day for 21.0 years (21.0 ttl pk-yrs)    Types: Cigarettes   Smokeless tobacco: Never  Vaping Use   Vaping status: Never Used  Substance Use Topics   Alcohol use: No   Drug use: Never     Allergies   Shellfish allergy   Review of Systems Review of  Systems   Physical Exam Triage Vital Signs ED Triage Vitals  Encounter Vitals Group     BP 08/10/23 1649 (!) 146/94     Systolic BP Percentile --      Diastolic BP Percentile --      Pulse Rate 08/10/23 1649 89     Resp --      Temp 08/10/23 1649 98.3 F (36.8 C)     Temp Source 08/10/23 1649 Oral     SpO2 08/10/23 1649 95 %     Weight 08/10/23 1648 218 lb (98.9 kg)     Height 08/10/23 1648 5\' 5"  (1.651 m)     Head Circumference --      Peak Flow --      Pain Score 08/10/23 1647 0     Pain Loc --      Pain Education --      Exclude from Growth Chart --    No data found.  Updated Vital Signs BP (!) 146/94 (BP Location: Left Arm)   Pulse 89   Temp 98.3 F (36.8 C) (Oral)   Ht 5\' 5"  (1.651 m)   Wt 218 lb (98.9 kg)   SpO2 95%   BMI 36.28 kg/m   Visual Acuity Right Eye Distance:   Left Eye Distance:   Bilateral Distance:    Right Eye Near:  Left Eye Near:    Bilateral Near:     Physical Exam Constitutional:      Appearance: Normal appearance.  Eyes:     Extraocular Movements: Extraocular movements intact.  Cardiovascular:     Rate and Rhythm: Normal rate and regular rhythm.     Pulses: Normal pulses.     Heart sounds: Normal heart sounds.  Pulmonary:     Effort: Pulmonary effort is normal.     Breath sounds: Normal breath sounds.  Neurological:     Mental Status: He is alert and oriented to person, place, and time. Mental status is at baseline.      UC Treatments / Results  Labs (all labs ordered are listed, but only abnormal results are displayed) Labs Reviewed - No data to display  EKG   Radiology No results found.  Procedures Procedures (including critical care time)  Medications Ordered in UC Medications - No data to display  Initial Impression / Assessment and Plan / UC Course  I have reviewed the triage vital signs and the nursing notes.  Pertinent labs & imaging results that were available during my care of the patient were  reviewed by me and considered in my medical decision making (see chart for details).  Shortness of breath  Vital signs are stable, O2 saturation 95% on room air, lungs are clear to auscultation, chest x-ray is pending, prophylactically placed on Augmentin as exposures to dogs mouth, prescribed prednisone for treatment of symptom, will make adjustments to treatment based on chest x-ray results, possibly adding albuterol inhaler, patient to follow-up if symptoms continue to persist or worsen despite treatment Final Clinical Impressions(s) / UC Diagnoses   Final diagnoses:  None   Discharge Instructions   None    ED Prescriptions   None    PDMP not reviewed this encounter.   Valinda Hoar, NP 08/10/23 [phone]

## 2023-08-10 NOTE — ED Triage Notes (Signed)
Pt c/o SOB x1day  Pt states that he gave mouth to mouth to his dog Wednesday night and believes he inhaled either vomit, acid, or stool.   Pt was lifting his dog and believes he hurt his back. Pt states his dog weighs 40lbs.

## 2023-08-10 NOTE — Discharge Instructions (Addendum)
Today you have been evaluated for shortness of breath  On exam your lungs are clear and you are getting enough air without assistance  Chest x-ray is pending and you will be notified of results via telephone either this evening or tomorrow morning  Begin Augmentin every morning and every evening for 7 days which is an antibiotic, take as directed, this provides coverage for the airway  Starting tomorrow begin prednisone every morning with food for 5 days which will open and relax the airway, should settle shortness of breath and wheezing  If you continue to have symptoms you may follow-up with his urgent care as needed for reevaluation

## 2024-08-16 ENCOUNTER — Encounter: Payer: Self-pay | Admitting: Emergency Medicine

## 2024-08-16 ENCOUNTER — Ambulatory Visit
Admission: EM | Admit: 2024-08-16 | Discharge: 2024-08-16 | Disposition: A | Discharge status: Home / Self Care | Attending: Student | Admitting: Student

## 2024-08-16 DIAGNOSIS — T22332A Burn of third degree of left upper arm, initial encounter: Secondary | ICD-10-CM | POA: Diagnosis not present

## 2024-08-16 MED ORDER — SILVER SULFADIAZINE 1 % EX CREA: 1.0000 | TOPICAL_CREAM | Freq: Two times a day (BID) | CUTANEOUS | 0 refills | Status: AC

## 2024-08-16 NOTE — ED Triage Notes (Signed)
 Patient states that he was welding a part together under a car and hit his arm on the hot piece with his left upper arm around 2 pm today.  Patient has redden blister area on his left forearm.

## 2024-08-16 NOTE — Discharge Instructions (Signed)
-  Silvadene cream twice daily for about 7 days -Wash with soap and water only, twice daily -Keep covered if the area could get dirty (I.e. while doing yardwork)

## 2024-08-16 NOTE — ED Provider Notes (Signed)
 [facility]    CSN: [id number] Arrival date & time: 08/16/24  1538      History   Chief Complaint Chief Complaint  Patient presents with   Burn    Left upper arm    HPI Phillip Palmer is a 43 y.o. male.  Presenting with burn to left upper inner arm sustained 2 hours ago.  Medical history GERD, chest pain.  Was working on a car.  The burn is minimally painful.  Tdap up-to-date within the last 5 years.  HPI  Past Medical History:  Diagnosis Date   GERD (gastroesophageal reflux disease)     Patient Active Problem List   Diagnosis Date Noted   Chest pain 11/15/2021   Elevated blood pressure reading 11/15/2021    History reviewed. No pertinent surgical history.     Home Medications    Prior to Admission medications   Medication Sig Start Date End Date Taking? Authorizing Provider  silver sulfADIAZINE (SILVADENE) 1 % cream Apply 1 Application topically 2 (two) times daily for 7 days. 08/16/24 08/23/24 Yes Trivia Heffelfinger E, PA-C  ibuprofen (ADVIL) 200 MG tablet Take 200 mg by mouth every 6 (six) hours as needed.    [provider]  sildenafil (REVATIO) 20 MG tablet Take 20 mg by mouth 3 (three) times daily as needed.    [provider]    Family History Family History  Problem Relation Age of Onset   Multiple sclerosis Mother    Alcohol abuse Father     Social History Social History   Tobacco Use   Smoking status: Every Day    Current packs/day: 1.00    Average packs/day: 1 pack/day for 21.0 years (21.0 ttl pk-yrs)    Types: Cigarettes   Smokeless tobacco: Never  Vaping Use   Vaping status: Never Used  Substance Use Topics   Alcohol use: No   Drug use: Never     Allergies   Shellfish allergy   Review of Systems Review of Systems  Skin:  Positive for wound (burn).     Physical Exam Triage Vital Signs ED Triage Vitals  Encounter Vitals Group     BP 08/16/24 1547 (!) 143/98     Girls Systolic BP Percentile --       Girls Diastolic BP Percentile --      Boys Systolic BP Percentile --      Boys Diastolic BP Percentile --      Pulse Rate 08/16/24 1547 94     Resp 08/16/24 1547 16     Temp 08/16/24 1547 98.5 F (36.9 C)     Temp Source 08/16/24 1547 Oral     SpO2 08/16/24 1547 95 %     Weight 08/16/24 1546 218 lb 0.6 oz (98.9 kg)     Height 08/16/24 1546 5' 5 (1.651 m)     Head Circumference --      Peak Flow --      Pain Score 08/16/24 1545 2     Pain Loc --      Pain Education --      Exclude from Growth Chart --    No data found.  Updated Vital Signs BP (!) 143/98 (BP Location: Right Arm)   Pulse 94   Temp 98.5 F (36.9 C) (Oral)   Resp 16   Ht 5' 5 (1.651 m)   Wt 218 lb 0.6 oz (98.9 kg)   SpO2 95%   BMI 36.28 kg/m   Visual  Acuity Right Eye Distance:   Left Eye Distance:   Bilateral Distance:    Right Eye Near:   Left Eye Near:    Bilateral Near:     Physical Exam Vitals reviewed.  Constitutional:      General: He is not in acute distress.    Appearance: Normal appearance. He is not ill-appearing.  HENT:     Head: Normocephalic and atraumatic.  Pulmonary:     Effort: Pulmonary effort is normal.  Skin:    Comments: [See image below] Left upper inner arm with 1.5 x 1.5 cm burn, sensitive to pressure but not pain.  No surrounding warmth, induration, drainage.  Neurological:     General: No focal deficit present.     Mental Status: He is alert and oriented to person, place, and time.  Psychiatric:        Mood and Affect: Mood normal.        Behavior: Behavior normal.        Thought Content: Thought content normal.        Judgment: Judgment normal.       UC Treatments / Results  Labs (all labs ordered are listed, but only abnormal results are displayed) Labs Reviewed - No data to display  EKG   Radiology No results found.  Procedures Procedures (including critical care time)  Medications Ordered in UC Medications - No data to display  Initial  Impression / Assessment and Plan / UC Course  I have reviewed the triage vital signs and the nursing notes.  Pertinent labs & imaging results that were available during my care of the patient were reviewed by me and considered in my medical decision making (see chart for details).     Patient is a 43 year old male presenting with third-degree burn sustained today.  The burn is small and minimally tender.  He is not immunocompromised.  Tdap up-to-date in the last 5 years.  Will manage with Silvadene cream.  Wound care discussed.  Final Clinical Impressions(s) / UC Diagnoses   Final diagnoses:  Full thickness burn of left upper arm, initial encounter     Discharge Instructions      -Silvadene cream twice daily for about 7 days -Wash with soap and water only, twice daily -Keep covered if the area could get dirty (I.e. while doing yardwork)    ED Prescriptions     Medication Sig Dispense Auth. Provider   silver sulfADIAZINE (SILVADENE) 1 % cream Apply 1 Application topically 2 (two) times daily for 7 days. 50 g [name], PA-C      PDMP not reviewed this encounter.   [name], PA-C 08/16/24 1555
